# Patient Record
Sex: Female | Born: 1995 | Hispanic: Yes | Marital: Single | Smoking: Never smoker
Health system: Southern US, Community
[De-identification: ages and names within clinical notes are randomized; demographics above are authoritative.]

## PROBLEM LIST (undated history)

## (undated) ENCOUNTER — Inpatient Hospital Stay: Payer: Self-pay

## (undated) DIAGNOSIS — Z789 Other specified health status: Secondary | ICD-10-CM

## (undated) HISTORY — PX: NO PAST SURGERIES: SHX2092

---
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
## 2015-03-16 ENCOUNTER — Ambulatory Visit
Admit: 2015-03-16 | Disposition: A | Discharge status: Home / Self Care | Payer: Self-pay | Attending: Family Medicine | Admitting: Family Medicine

## 2015-08-10 ENCOUNTER — Inpatient Hospital Stay
Admission: EM | Admit: 2015-08-10 | Discharge: 2015-08-12 | DRG: 774 | Disposition: A | Discharge status: Home / Self Care | Payer: Medicaid Other | Attending: Obstetrics and Gynecology | Admitting: Obstetrics and Gynecology

## 2015-08-10 ENCOUNTER — Encounter: Payer: Self-pay | Admitting: *Deleted

## 2015-08-10 DIAGNOSIS — Z3A40 40 weeks gestation of pregnancy: Secondary | ICD-10-CM | POA: Diagnosis present

## 2015-08-10 DIAGNOSIS — A568 Sexually transmitted chlamydial infection of other sites: Secondary | ICD-10-CM | POA: Diagnosis present

## 2015-08-10 DIAGNOSIS — Z79899 Other long term (current) drug therapy: Secondary | ICD-10-CM | POA: Diagnosis present

## 2015-08-10 DIAGNOSIS — O9832 Other infections with a predominantly sexual mode of transmission complicating childbirth: Principal | ICD-10-CM | POA: Diagnosis present

## 2015-08-10 HISTORY — DX: Other specified health status: Z78.9

## 2015-08-10 LAB — CHLAMYDIA/NGC RT PCR (ARMC ONLY)
CHLAMYDIA TR: NOT DETECTED
N gonorrhoeae: NOT DETECTED

## 2015-08-10 LAB — TYPE AND SCREEN
ABO/RH(D): O POS
Antibody Screen: NEGATIVE

## 2015-08-10 LAB — CBC
HCT: 36.7 % (ref 35.0–47.0)
HEMOGLOBIN: 12.1 g/dL (ref 12.0–16.0)
MCH: 29.9 pg (ref 26.0–34.0)
MCHC: 33 g/dL (ref 32.0–36.0)
MCV: 90.6 fL (ref 80.0–100.0)
Platelets: 225 10*3/uL (ref 150–440)
RBC: 4.05 MIL/uL (ref 3.80–5.20)
RDW: 13.7 % (ref 11.5–14.5)
WBC: 9.8 10*3/uL (ref 3.6–11.0)

## 2015-08-10 LAB — ABO/RH: ABO/RH(D): O POS

## 2015-08-10 MED ORDER — BISACODYL 10 MG RE SUPP: 10.0000 mg | Freq: Every day | RECTAL | Status: DC | PRN

## 2015-08-10 MED ORDER — LANOLIN HYDROUS EX OINT: TOPICAL_OINTMENT | CUTANEOUS | Status: DC | PRN

## 2015-08-10 MED ORDER — OXYTOCIN 40 UNITS IN LACTATED RINGERS INFUSION - SIMPLE MED
62.5000 mL/h | INTRAVENOUS | Status: DC
  Filled 2015-08-10: qty 1000

## 2015-08-10 MED ORDER — SODIUM CHLORIDE 0.9 % IJ SOLN: 3.0000 mL | Freq: Two times a day (BID) | INTRAMUSCULAR | Status: DC

## 2015-08-10 MED ORDER — PRENATAL MULTIVITAMIN CH
1.0000 | ORAL_TABLET | Freq: Every day | ORAL | Status: DC
  Administered 2015-08-11 – 2015-08-12 (×2): 1 via ORAL
  Filled 2015-08-10 (×2): qty 1

## 2015-08-10 MED ORDER — SODIUM CHLORIDE 0.9 % IV SOLN: 2.0000 g | Freq: Once | INTRAVENOUS | Status: DC

## 2015-08-10 MED ORDER — MISOPROSTOL 200 MCG PO TABS
800.0000 ug | ORAL_TABLET | Freq: Once | ORAL | Status: AC
  Administered 2015-08-10: 800 ug via RECTAL

## 2015-08-10 MED ORDER — MEASLES, MUMPS & RUBELLA VAC ~~LOC~~ INJ: 0.5000 mL | INJECTION | Freq: Once | SUBCUTANEOUS | Status: DC

## 2015-08-10 MED ORDER — SODIUM CHLORIDE 0.9 % IV SOLN: 250.0000 mL | INTRAVENOUS | Status: DC | PRN

## 2015-08-10 MED ORDER — DIBUCAINE 1 % RE OINT: 1.0000 "application " | TOPICAL_OINTMENT | RECTAL | Status: DC | PRN

## 2015-08-10 MED ORDER — ONDANSETRON HCL 4 MG/2ML IJ SOLN: 4.0000 mg | Freq: Four times a day (QID) | INTRAMUSCULAR | Status: DC | PRN

## 2015-08-10 MED ORDER — ZOLPIDEM TARTRATE 5 MG PO TABS: 5.0000 mg | ORAL_TABLET | Freq: Every evening | ORAL | Status: DC | PRN

## 2015-08-10 MED ORDER — TETANUS-DIPHTH-ACELL PERTUSSIS 5-2.5-18.5 LF-MCG/0.5 IM SUSP: 0.5000 mL | Freq: Once | INTRAMUSCULAR | Status: DC

## 2015-08-10 MED ORDER — WITCH HAZEL-GLYCERIN EX PADS: 1.0000 "application " | MEDICATED_PAD | CUTANEOUS | Status: DC | PRN

## 2015-08-10 MED ORDER — BUTORPHANOL TARTRATE 1 MG/ML IJ SOLN: 1.0000 mg | INTRAMUSCULAR | Status: DC | PRN

## 2015-08-10 MED ORDER — ACETAMINOPHEN 325 MG PO TABS: 650.0000 mg | ORAL_TABLET | ORAL | Status: DC | PRN

## 2015-08-10 MED ORDER — SENNOSIDES-DOCUSATE SODIUM 8.6-50 MG PO TABS
2.0000 | ORAL_TABLET | ORAL | Status: DC
  Administered 2015-08-11: 2 via ORAL
  Filled 2015-08-10: qty 2

## 2015-08-10 MED ORDER — LACTATED RINGERS IV SOLN: 500.0000 mL | INTRAVENOUS | Status: DC | PRN

## 2015-08-10 MED ORDER — OXYCODONE-ACETAMINOPHEN 5-325 MG PO TABS: 1.0000 | ORAL_TABLET | ORAL | Status: DC | PRN

## 2015-08-10 MED ORDER — ONDANSETRON HCL 4 MG/2ML IJ SOLN: 4.0000 mg | INTRAMUSCULAR | Status: DC | PRN

## 2015-08-10 MED ORDER — OXYTOCIN BOLUS FROM INFUSION: 500.0000 mL | INTRAVENOUS | Status: DC

## 2015-08-10 MED ORDER — DIPHENHYDRAMINE HCL 25 MG PO CAPS: 25.0000 mg | ORAL_CAPSULE | Freq: Four times a day (QID) | ORAL | Status: DC | PRN

## 2015-08-10 MED ORDER — OXYTOCIN 10 UNIT/ML IJ SOLN
10.0000 [IU] | Freq: Once | INTRAMUSCULAR | Status: AC | PRN
  Administered 2015-08-10: 10 [IU] via INTRAMUSCULAR

## 2015-08-10 MED ORDER — CITRIC ACID-SODIUM CITRATE 334-500 MG/5ML PO SOLN: 30.0000 mL | ORAL | Status: DC | PRN

## 2015-08-10 MED ORDER — LACTATED RINGERS IV SOLN: INTRAVENOUS | Status: DC

## 2015-08-10 MED ORDER — BENZOCAINE-MENTHOL 20-0.5 % EX AERO
1.0000 "application " | INHALATION_SPRAY | CUTANEOUS | Status: DC | PRN
  Administered 2015-08-10: 1 via TOPICAL
  Filled 2015-08-10: qty 56

## 2015-08-10 MED ORDER — ONDANSETRON HCL 4 MG PO TABS: 4.0000 mg | ORAL_TABLET | ORAL | Status: DC | PRN

## 2015-08-10 MED ORDER — OXYCODONE-ACETAMINOPHEN 5-325 MG PO TABS: 2.0000 | ORAL_TABLET | ORAL | Status: DC | PRN

## 2015-08-10 MED ORDER — SIMETHICONE 80 MG PO CHEW: 80.0000 mg | CHEWABLE_TABLET | ORAL | Status: DC | PRN

## 2015-08-10 MED ORDER — SODIUM CHLORIDE 0.9 % IJ SOLN: 3.0000 mL | INTRAMUSCULAR | Status: DC | PRN

## 2015-08-10 MED ORDER — FLEET ENEMA 7-19 GM/118ML RE ENEM: 1.0000 | ENEMA | Freq: Every day | RECTAL | Status: DC | PRN

## 2015-08-10 MED ORDER — OXYTOCIN 40 UNITS IN LACTATED RINGERS INFUSION - SIMPLE MED: 62.5000 mL/h | INTRAVENOUS | Status: DC | PRN

## 2015-08-10 MED ORDER — LIDOCAINE HCL (PF) 1 % IJ SOLN
30.0000 mL | INTRAMUSCULAR | Status: DC | PRN
  Filled 2015-08-10: qty 30

## 2015-08-10 MED ORDER — IBUPROFEN 600 MG PO TABS
600.0000 mg | ORAL_TABLET | Freq: Four times a day (QID) | ORAL | Status: DC
  Administered 2015-08-10 – 2015-08-12 (×8): 600 mg via ORAL
  Filled 2015-08-10 (×8): qty 1

## 2015-08-10 NOTE — Progress Notes (Signed)
S: Rating UC's an 8 on 1-10 scalel. + CTX, + LOF, no  VB.  O: Filed Vitals:   08/10/15 0406 08/10/15 0415 08/10/15 0816  BP: 123/82 112/79 102/65  Pulse: 70 76 68  Temp: 97.7 F (36.5 C)  98.6 F (37 C)  TempSrc: Oral  Oral  Resp: 18  16  Height: 5' (1.524 m)    Weight: 150 lb (68.04 kg)      Gen: NAD, AAOx3      Abd: Gravid     Ext: Non-tender, Nonedmeatous    FHT: 140 mod var, 1  Deceleration after AROM to 90- with recover TOCO: Q 1  min SVE:5/90/vtx-1   A/P:  19 y.o. yo G1P0 at [redacted]w[redacted]d for labor.  Labor: progressing well, plans natural.   FWB: Reassuring Cat 2 tracing. EFW  6#8oz. 1 decel noted and now Cat 1.  GBS: neg    Sharee Pimple 9:29 AM

## 2015-08-10 NOTE — Progress Notes (Addendum)
VAGINAL DELIVERY NOTE:  Date of Delivery: 08/10/2015 Primary OB: [facility]  Gestational Age/EDD: [redacted]w[redacted]d 08/10/2015, by Other Basis Antepartum complications: none Attending Physician: Hal Neer, CNM/Dr Beasely Attending (not present), Carlean Jews, CNM  Delivery Type: spontaneous vaginal delivery  Anesthesia: local Laceration: 1st degree Lt peri-urethral and 2nd degree perineal Episiotomy: none Placenta: spontaneous Intrapartum complications: None Estimated Blood Loss: 400 ml GBS: Neg Procedure Details:  Assisting Carlean Jews, CNM with the delivery of infant: Vtx, ant and post shoulder and body del at 1205 and to mom's abd, CCx2 and cut. Cord blood collected.  1st degree and 2nd deg perineal lac repaired with 3-0 vicryl Rapide on CT with 1% xylocaine x 10 mls. Bleeding was controlled with Cytotec. FF and lochia mod. VSS. Hemostasis achieved however, pt passed some clots after the repair. Pitocin 10 mg IM given. Needle and sponge ct correct.  I assisted with the repair of the birth.  Baby: Liveborn female, Apgars, weight7#, 5 oz, baby named Carla Brown. Skin to skin couplet care given.

## 2015-08-10 NOTE — H&P (Signed)
Obstetrics Admission History & Physical  Referring Provider: [facility] Primary OBGYN: [facility]   Chief Complaint: labor pains History of Present Illness  19 y.o. G1P0 @ [redacted]w[redacted]d (Dating by LMP of 11/03/14 with EDD of 08/10/15. Pregnancy complicated by: Chlamydia, UTI, Rt knee pain, acne, Rt hip pain, low back pain presents in labor to [facility] with UC's every 2 mins becoming more uncomfortable.  Carla Brown presents for labor S/S.   Review of Systems: Positive for labor contractions,   Otherwise, her 12 point review of systems is negative or as noted in the History of Present Illness.  Patient Active Problem List   Diagnosis Date Noted  . Indication for care in labor or delivery 08/10/2015     PMHx:  Pmh: ACNE, CHLAMYDIA, UTI, RT KNEE PAIN, RT HIP PAIN, LOW BACK PAIN PSHx:  Past Surgical History  Procedure Laterality Date  . No past surgeries     Medications:  Prescriptions prior to admission  Medication Sig Dispense Refill Last Dose  . Prenatal Vit-Fe Fumarate-FA (PRENATAL MULTIVITAMIN) TABS tablet Take 1 tablet by mouth daily at 12 noon.      Allergies: has No Known Allergies. OBHx:  OB History  Gravida Para Term Preterm AB SAB TAB Ectopic Multiple Living  1             # Outcome Date GA Lbr Len/2nd Weight Sex Delivery Anes PTL Lv  1 Current              GYNHx:  History of abnormal pap smears: No. History of STIs: Yes.  Marland Kitchen             FHx: History reviewed. No pertinent family history. Soc Hx:  Social History   Social History  . Marital Status: Single    Spouse Name: N/A  . Number of Children: N/A  . Years of Education: N/A   Occupational History  . Not on file.   Social History Main Topics  . Smoking status: Never Smoker   . Smokeless tobacco: Not on file  . Alcohol Use: No  . Drug Use: No  . Sexual Activity: Yes   Other Topics Concern  . Not on file   Social History Narrative  . No narrative on file   FOB is  involved with the pregnancy   Objective   Filed Vitals:   08/10/15 0415  BP: 112/79  Pulse: 76  Temp:   Resp:    Temp:  [97.7 F (36.5 C)] 97.7 F (36.5 C) (09/16 0406) Pulse Rate:  [70-76] 76 (09/16 0415) Resp:  [18] 18 (09/16 0406) BP: (112-123)/(79-82) 112/79 mmHg (09/16 0415) Weight:  [150 lb (68.04 kg)] 150 lb (68.04 kg) (09/16 0406) Temp (24hrs), Avg:97.7 F (36.5 C), Min:97.7 F (36.5 C), Max:97.7 F (36.5 C)  No intake or output data in the 24 hours ending 08/10/15 0842    Current Vital Signs 24h Vital Sign Ranges  T 97.7 F (36.5 C) Temp  Avg: 97.7 F (36.5 C)  Min: 97.7 F (36.5 C)  Max: 97.7 F (36.5 C)  BP 112/79 mmHg BP  Min: 112/79  Max: 123/82  HR 76 Pulse  Avg: 73  Min: 70  Max: 76  RR 18 Resp  Avg: 18  Min: 18  Max: 18  SaO2     No Data Recorded       24 Hour I/O Current Shift I/O  Time Ins Outs  EFM: 140 Toco: Q 1-2 MINS  General: Well nourished, well developed female in no acute distress.  Skin:  Warm and dry.  Cardiovascular: S1S2, RRR, No M/R/G. Respiratory:  Clear to auscultation bilateral. Normal respiratory effort. No W/R/R. Abdomen:gRAVID.  Neuro/Psych:  Normal mood and affect.  Extrems: 1+/0  SVE: 3/VTX Leopolds/EFW: VTX  Labs  Admit labs being done Ultrasounds: WNL   Perinatal info  O pos, Antibody neg, Rubella immune, Varicella immune, RPR NR, GC/CH neg, HIV NR, HbSag Neg.   Assessment & Plan   19 y.o. G1P0 @ [redacted]w[redacted]d with signs and symptoms, with labor to be admitted.  IUP:  At terem GBS: neg Continue uterine and fetal monitoring. Monitor Vital Signs.  Antic SVD   Sharee Pimple, MSN, CNM, FNP [facility]

## 2015-08-10 NOTE — Progress Notes (Signed)
   08/10/15 1000  Clinical Encounter Type  Visited With Health care provider  Visit Type Initial  Referral From Nurse  Consult/Referral To Chaplain  Received order, pt declined chaplain visit/ Chap. Karl Ito [phone]

## 2015-08-11 LAB — CBC
HEMATOCRIT: 31.8 % — AB (ref 35.0–47.0)
HEMOGLOBIN: 10.5 g/dL — AB (ref 12.0–16.0)
MCH: 29.9 pg (ref 26.0–34.0)
MCHC: 32.9 g/dL (ref 32.0–36.0)
MCV: 90.9 fL (ref 80.0–100.0)
Platelets: 208 10*3/uL (ref 150–440)
RBC: 3.5 MIL/uL — ABNORMAL LOW (ref 3.80–5.20)
RDW: 13.9 % (ref 11.5–14.5)
WBC: 12.9 10*3/uL — ABNORMAL HIGH (ref 3.6–11.0)

## 2015-08-11 LAB — RPR: RPR: NONREACTIVE

## 2015-08-11 NOTE — Progress Notes (Signed)
Post Partum Day 1 Subjective: no complaints  Objective: Blood pressure 92/47, pulse 88, temperature 97.9 F (36.6 C), temperature source Oral, resp. rate 20, height 5' (1.524 m), weight 150 lb (68.04 kg), SpO2 100 %, unknown if currently breastfeeding.  Physical Exam:  General: alert and cooperative  Heart: S1S2, RRR, No M/R/G. Lungs: CTA bilat Lochia: appropriate, no clots Uterine Fundus: firm, U-2 Eating and urinating without diff  DVT Evaluation: No evidence of DVT seen on physical exam.   Recent Labs  08/10/15 0915 08/11/15 0600  HGB 12.1 10.5*  HCT 36.7 31.8*  WBC 9.8 12.9*  PLT 225 208    Assessment/Plan: Plan for discharge tomorrow   LOS: 1 day   Carla Brown 08/11/2015, 11:26 AM

## 2015-08-12 NOTE — Discharge Summary (Signed)
See last progress note.

## 2015-08-12 NOTE — Progress Notes (Addendum)
Post Partum Day  2 Interpreter with CNM  Subjective: no complaints, stating that baby keeps drinking and drinking and may not be getting enough.  Objective: Blood pressure 92/58, pulse 92, temperature 98.2 F (36.8 C), temperature source Oral, resp. rate 18, height 5' (1.524 m), weight 150 lb (68.04 kg), SpO2 99 %, unknown if currently breastfeeding.  Physical Exam:  General: alert, Awake & oriented x 3 Lochia: appropriate Uterine Fundus: firm Perineum: healing well DVT Evaluation: No evidence of DVT seen on physical exam. Neg Homan's sign   Recent Labs  08/10/15 0915 08/11/15 0600  HGB 12.1 10.5*  HCT 36.7 31.8*  WBC 9.8 12.9*  PLT 225 208   Assessment/Plan: A: 1. NSVD PP day 2 2. Breast/Bottle feeding  P: 1. DC home today 2. Plans Depo at [facility]   LOS: 2 days   Sharee Pimple 08/12/2015, 10:05 AM

## 2015-08-12 NOTE — Discharge Instructions (Signed)
OB Discharge Summary  Patient Name: Carla Brown DOB: 1996-05-13 MRN: [id number] None  Date of admission: 08/10/2015  Date of discharge: 08/12/15   Admitting diagnosis: Onset of Labor   Intrauterine pregnancy: [redacted]w[redacted]d  Secondary diagnosis: Anemia in pregnancy     Discharge diagnosis: NSVD of viable female infant Method of delivery: cesarean vaginal   Intrapartum Procedures: Atificial rupture of membranes   Post partum procedures:none  Complications: heavy bleeding with lacs and uterine atony so Cytotec was given pp   Hospital course: Stable  Physical exam:  General: alert and cooperative Lochia: moderate Uterine Fundus: firm, U-2 Perineum: healing, no hematoma DVT Evaluation: No evidence of DVT seen on physical exam.  Labs: Lab Results  Component Value Date   WBC 12.9* 08/11/2015   HGB 10.5* 08/11/2015   HCT 31.8* 08/11/2015   MCV 90.9 08/11/2015   PLT 208 08/11/2015   CMP Latest Ref Rng 12/12/2013  Glucose 65-99 mg/dL 83  BUN 4-09 mg/dL 14  Creatinine 8.11-9.14 mg/dL 7.82  Sodium 956-213 mmol/L 135  Potassium 3.3-4.7 mmol/L 3.6  Chloride 97-107 mmol/L 103  CO2 16-25 mmol/L 27(H)  Calcium 9.0-10.7 mg/dL 9.0  Total Protein 0.8-6.5 g/dL 7.8(I)  Total Bilirubin 0.2-1.0 mg/dL 0.4  Alkaline Phos - 87  AST 0-26 Unit/L 26  ALT 12-78 U/L 21    Discharge instruction: per After Visit Summary.  Medications:   Medication List    TAKE these medications        prenatal multivitamin Tabs tablet  Take 1 tablet by mouth daily at 12 noon.        Diet: routine diet  Activity: Advance as tolerated. Pelvic rest for 6 weeks. Nothing in the vagina for 6 weeks, i.e. no tampons, douching, or sex.   Outpatient follow up:6 weeks  Postpartum contraception:IUD Depo shots  Newborn Data: Disposition:home with mother  Apgar: APGAR (1 MIN): 9   APGAR (5 MINS): 9   APGAR (10 MINS):     Baby Feeding: Breast/Bottle feeding  08/12/2015 Sharee Pimple,  CNM

## 2015-08-12 NOTE — Progress Notes (Signed)
Patient discharged home with infant. Discharge instructions, prescriptions and follow up appointment given to and reviewed with patient. Patient verbalized understanding. Escorted out with infant by auxillary. 

## 2015-08-12 NOTE — Discharge Summary (Signed)
Care After Vaginal Delivery °Congratulations on your new baby!! ° °Refer to this sheet in the next few weeks. These discharge instructions provide you with information on caring for yourself after delivery. Your caregiver may also give you specific instructions. Your treatment has been planned according to the most current medical practices available, but problems sometimes occur. Call your caregiver if you have any problems or questions after you go home. ° °HOME CARE INSTRUCTIONS °· Take over-the-counter or prescription medicines only as directed by your caregiver or pharmacist. °· Do not drink alcohol, especially if you are breastfeeding or taking medicine to relieve pain. °· Do not chew or smoke tobacco. °· Do not use illegal drugs. °· Continue to use good perineal care. Good perineal care includes: °¨ Wiping your perineum from front to back. °¨ Keeping your perineum clean. °· Do not use tampons or douche until your caregiver says it is okay. °· Shower, wash your hair, and take tub baths as directed by your caregiver. °· Wear a well-fitting bra that provides breast support. °· Eat healthy foods. °· Drink enough fluids to keep your urine clear or pale yellow. °· Eat high-fiber foods such as whole grain cereals and breads, brown rice, beans, and fresh fruits and vegetables every day. These foods may help prevent or relieve constipation. °· Follow your caregiver's recommendations regarding resumption of activities such as climbing stairs, driving, lifting, exercising, or traveling. Specifically, no driving for two weeks, so that you are comfortable reacting quickly in an emergency. °· Talk to your caregiver about resuming sexual activities. Resumption of sexual activities is dependent upon your risk of infection, your rate of healing, and your comfort and desire to resume sexual activity. Usually we recommend waiting about six weeks, or until your bleeding stops and you are interested in sex. °· Try to have someone  help you with your household activities and your newborn for at least a few days after you leave the hospital. Even longer is better. °· Rest as much as possible. Try to rest or take a nap when your newborn is sleeping. Sleep deprivation can be very hard after delivery. °· Increase your activities gradually. °· Keep all of your scheduled postpartum appointments. It is very important to keep your scheduled follow-up appointments. At these appointments, your caregiver will be checking to make sure that you are healing physically and emotionally. ° °SEEK MEDICAL CARE IF:  °· You are passing large clots from your vagina.  °· You have a foul smelling discharge from your vagina. °· You have trouble urinating. °· You are urinating frequently. °· You have pain when you urinate. °· You have a change in your bowel movements. °· You have increasing redness, pain, or swelling near your vaginal incision (episiotomy) or vaginal tear. °· You have pus draining from your episiotomy or vaginal tear. °· Your episiotomy or vaginal tear is separating. °· You have painful, hard, or reddened breasts. °· You have a severe headache. °· You have blurred vision or see spots. °· You feel sad or depressed. °· You have thoughts of hurting yourself or your newborn. °· You have questions about your care, the care of your newborn, or medicines. °· You are dizzy or light-headed. °· You have a rash. °· You have nausea or vomiting. °· You were breastfeeding and have not had a menstrual period within 12 weeks after you stopped breastfeeding. °· You are not breastfeeding and have not had a menstrual period by the 12th week after delivery. °· You   have a fever. ° °SEEK IMMEDIATE MEDICAL CARE IF:  °· You have persistent pain. °· You have chest pain. °· You have shortness of breath. °· You faint. °· You have leg pain. °· You have stomach pain. °· Your vaginal bleeding saturates two or more sanitary pads in 1 hour. ° °MAKE SURE YOU:  °· Understand these  instructions. °· Will get help right away if you are not doing well or get worse. °·  °Document Released: 11/07/2000 Document Revised: 03/27/2014 Document Reviewed: 07/07/2012 ° °ExitCare® Patient Information ©2015 [employer]. This information is not intended to replace advice given to you by your health care provider. Make sure you discuss any questions you have with your health care provider. ° °

## 2015-11-25 NOTE — L&D Delivery Note (Signed)
Delivery Note At 6:38 AM a viable female was delivered via Vaginal, Spontaneous Delivery (Presentation: ;  ).  APGAR: 7, 9; weight 6 lb 11.2 oz (3040 g).  + meconium noted and placental membranes stained  Placenta status: ,intact .  Cord:3v  with the following complications: . Inadequate treatment for +GBS , delayed cord clamping  Anesthesia:lidocaine   Episiotomy: None Lacerations: 1st degree;Perineal Suture Repair: 3.0 Est. Blood Loss (mL):  75 cc  Mom to postpartum.  Baby to Couplet care / Skin to Skin.  SCHERMERHORN,THOMAS 08/30/2016, 7:33 AM

## 2016-06-10 ENCOUNTER — Encounter: Payer: Self-pay | Admitting: Emergency Medicine

## 2016-06-10 ENCOUNTER — Emergency Department: Payer: Medicaid Other

## 2016-06-10 ENCOUNTER — Emergency Department
Admission: EM | Admit: 2016-06-10 | Discharge: 2016-06-10 | Disposition: A | Discharge status: Home / Self Care | Payer: Medicaid Other | Attending: Emergency Medicine | Admitting: Emergency Medicine

## 2016-06-10 DIAGNOSIS — R079 Chest pain, unspecified: Secondary | ICD-10-CM

## 2016-06-10 DIAGNOSIS — Z3A28 28 weeks gestation of pregnancy: Secondary | ICD-10-CM | POA: Insufficient documentation

## 2016-06-10 DIAGNOSIS — R0602 Shortness of breath: Secondary | ICD-10-CM

## 2016-06-10 DIAGNOSIS — Z79899 Other long term (current) drug therapy: Secondary | ICD-10-CM | POA: Diagnosis not present

## 2016-06-10 DIAGNOSIS — J942 Hemothorax: Secondary | ICD-10-CM | POA: Diagnosis not present

## 2016-06-10 DIAGNOSIS — O99512 Diseases of the respiratory system complicating pregnancy, second trimester: Secondary | ICD-10-CM | POA: Insufficient documentation

## 2016-06-10 LAB — BASIC METABOLIC PANEL
Anion gap: 7 (ref 5–15)
BUN: 8 mg/dL (ref 6–20)
CALCIUM: 8.2 mg/dL — AB (ref 8.9–10.3)
CHLORIDE: 105 mmol/L (ref 101–111)
CO2: 21 mmol/L — AB (ref 22–32)
Glucose, Bld: 95 mg/dL (ref 65–99)
POTASSIUM: 3.5 mmol/L (ref 3.5–5.1)
SODIUM: 133 mmol/L — AB (ref 135–145)

## 2016-06-10 LAB — CBC
HCT: 33.2 % — ABNORMAL LOW (ref 35.0–47.0)
Hemoglobin: 11.5 g/dL — ABNORMAL LOW (ref 12.0–16.0)
MCH: 30.1 pg (ref 26.0–34.0)
MCHC: 34.8 g/dL (ref 32.0–36.0)
MCV: 86.6 fL (ref 80.0–100.0)
PLATELETS: 250 10*3/uL (ref 150–440)
RBC: 3.83 MIL/uL (ref 3.80–5.20)
RDW: 13.2 % (ref 11.5–14.5)
WBC: 10.8 10*3/uL (ref 3.6–11.0)

## 2016-06-10 LAB — TROPONIN I: Troponin I: <0.03 ng/mL (ref <0.03)

## 2016-06-10 LAB — FIBRIN DERIVATIVES D-DIMER (ARMC ONLY): FIBRIN DERIVATIVES D-DIMER (ARMC): 860 — AB (ref 0–499)

## 2016-06-10 MED ORDER — HYDROCODONE-ACETAMINOPHEN 5-325 MG PO TABS: 1.0000 | ORAL_TABLET | Freq: Four times a day (QID) | ORAL | Status: DC | PRN

## 2016-06-10 MED ORDER — ONDANSETRON 4 MG PO TBDP: 4.0000 mg | ORAL_TABLET | Freq: Three times a day (TID) | ORAL | Status: DC | PRN

## 2016-06-10 MED ORDER — ONDANSETRON 4 MG PO TBDP
4.0000 mg | ORAL_TABLET | Freq: Once | ORAL | Status: AC
  Administered 2016-06-10: 4 mg via ORAL
  Filled 2016-06-10: qty 1

## 2016-06-10 MED ORDER — IOPAMIDOL (ISOVUE-370) INJECTION 76%
100.0000 mL | Freq: Once | INTRAVENOUS | Status: AC | PRN
  Administered 2016-06-10: 100 mL via INTRAVENOUS

## 2016-06-10 MED ORDER — HYDROCODONE-ACETAMINOPHEN 5-325 MG PO TABS
1.0000 | ORAL_TABLET | Freq: Once | ORAL | Status: AC
  Administered 2016-06-10: 1 via ORAL
  Filled 2016-06-10: qty 1

## 2016-06-10 NOTE — ED Provider Notes (Signed)
[facility] Emergency Department Provider Note  ___________________________________________  Time seen: Approximately 3:34 AM  I have reviewed the triage vital signs and the nursing notes.   HISTORY  Chief Complaint Back Pain and Shortness of Breath    HPI Carla Brown is a 20 y.o. female Hispanic who along with interpreter stated that she was having shortness of breath and pain in her right upper chest and right upper back every time she takes a deep breath. Patient states that her symptoms started yesterday morning. She denies any recent cough or cold symptoms. She's had no fever or chills. She's had no nausea vomiting or change in her bowels. She had no significant rash. Patient is approximately 28 weeks OB. Patient is a G2 P1   Past Medical History  Diagnosis Date  . Medical history non-contributory     Patient Active Problem List   Diagnosis Date Noted  . Indication for care in labor or delivery 08/10/2015  . Indication for care in labor and delivery, delivered 08/10/2015    Past Surgical History  Procedure Laterality Date  . No past surgeries      Current Outpatient Rx  Name  Route  Sig  Dispense  Refill  . Prenatal Vit-Fe Fumarate-FA (PRENATAL MULTIVITAMIN) TABS tablet   Oral   Take 1 tablet by mouth daily at 12 noon.         Marland Kitchen. HYDROcodone-acetaminophen (NORCO/VICODIN) 5-325 MG tablet   Oral   Take 1 tablet by mouth every 6 (six) hours as needed for moderate pain.   15 tablet   0   . ondansetron (ZOFRAN ODT) 4 MG disintegrating tablet   Oral   Take 1 tablet (4 mg total) by mouth every 8 (eight) hours as needed for nausea or vomiting.   12 tablet   0     Allergies Review of patient's allergies indicates no known allergies.  No family history on file.  Social History Social History  Substance Use Topics  . Smoking status: Never Smoker   . Smokeless tobacco: None  . Alcohol Use: No    Review of  Systems Constitutional: No fever/chills Eyes: No visual changes. ENT: No sore throat. Cardiovascular: Positive for pain in her right upper chest and right upper back every time she takes a deep breath. Respiratory: Positive for shortness of breath. Gastrointestinal: No abdominal pain.  No nausea, no vomiting.  No diarrhea.  No constipation. Genitourinary: Negative for dysuria. Musculoskeletal: Negative for back pain. Skin: Negative for rash. Neurological: Negative for headaches, focal weakness or numbness.  10-point ROS otherwise negative.  ____________________________________________   PHYSICAL EXAM:  VITAL SIGNS: ED Triage Vitals  Enc Vitals Group     BP 06/10/16 0318 109/69 mmHg     Pulse Rate 06/10/16 0318 99     Resp 06/10/16 0318 18     Temp 06/10/16 0318 97.7 F (36.5 C)     Temp Source 06/10/16 0318 Oral     SpO2 06/10/16 0318 97 %     Weight 06/10/16 0318 154 lb (69.854 kg)     Height 06/10/16 0318 5\' 2"  (1.575 m)     Head Cir --      Peak Flow --      Pain Score 06/10/16 0320 8     Pain Loc --      Pain Edu? --      Excl. in GC? --     Constitutional: Alert and oriented. Well appearing and in  no acute distress. Eyes: Conjunctivae are normal. PERRL. EOMI. Head: Atraumatic. Nose: No congestion/rhinnorhea. Mouth/Throat: Mucous membranes are moist.  Oropharynx non-erythematous. Neck: No stridor.   Cardiovascular: Normal rate, regular rhythm. Grossly normal heart sounds.  Good peripheral circulation. Respiratory: Patient is mildly tachypneic.  No retractions. Lungs CTAB. She has no palpable chest wall tenderness Gastrointestinal: Soft and nontender. No distention. No abdominal bruits. No CVA tenderness. Patient with 28 week gravid uterus Musculoskeletal: No lower extremity tenderness nor edema.  No joint effusions. Neurologic:  Normal speech and language. No gross focal neurologic deficits are appreciated. No gait instability. Skin:  Skin is warm, dry and  intact. No rash noted. Psychiatric: Mood and affect are normal. Speech and behavior are normal.  ____________________________________________   LABS (all labs ordered are listed, but only abnormal results are displayed)  Labs Reviewed  BASIC METABOLIC PANEL - Abnormal; Notable for the following:    Sodium 133 (*)    CO2 21 (*)    Creatinine, Ser <0.30 (*)    Calcium 8.2 (*)    All other components within normal limits  CBC - Abnormal; Notable for the following:    Hemoglobin 11.5 (*)    HCT 33.2 (*)    All other components within normal limits  FIBRIN DERIVATIVES D-DIMER ([facility] ONLY) - Abnormal; Notable for the following:    Fibrin derivatives D-dimer ([facility]) 860 (*)    All other components within normal limits  BLOOD GAS, ARTERIAL - Abnormal; Notable for the following:    pCO2 arterial 30 (*)    Bicarbonate 20.9 (*)    Acid-base deficit 2.3 (*)    Allens test (pass/fail) POSITIVE (*)    All other components within normal limits  TROPONIN I   ____________________________________________  EKG  ED ECG REPORT I, Leona Carry, the attending physician, personally viewed and interpreted this ECG.   Date: 06/10/2016  EKG Time: 0333  Rate: 90  Rhythm: normal EKG, normal sinus rhythm  Axis: normal  Intervals:none  ST&T Change: none  ____________________________________________  RADIOLOGY  Ct Angio Chest Pe W/cm &/or Wo Cm  06/10/2016  CLINICAL DATA:  Shortness of breath and right-sided chest pain. Second trimester pregnancy. EXAM: CT ANGIOGRAPHY CHEST WITH CONTRAST TECHNIQUE: Multidetector CT imaging of the chest was performed using the standard protocol during bolus administration of intravenous contrast. Multiplanar CT image reconstructions and MIPs were obtained to evaluate the vascular anatomy. CONTRAST:  73 cc Isovue 370 intravenous COMPARISON:  None. FINDINGS: Cardiovascular: Normal heart size. No pericardial effusion. Negative pulmonary embolism. Negative thoracic  aorta. Mediastinum:  Negative for adenopathy. Lungs/Pleura: There is a trace right pleural effusion with high-density appearance in the posterior sulcus. The neighboring lung is atelectatic. No visible embolism to suggest infarct. No neighboring rib fracture or trauma history to suggest traumatic hemothorax. Spontaneous hemothorax in pregnancy have been described but rare. No historical or laboratory indication of pneumonia. Two-view follow-up chest x-ray after delivery would be reassuring, assuming imaging is not needed sooner. Upper abdomen: No acute findings. Musculoskeletal: No chest wall mass or suspicious bone lesions identified. Review of the MIP images confirms the above findings. IMPRESSION: 1. Trace right pleural effusion with atelectasis. Pleural fluid is complex with internal high-density as seen with hemothorax; unusual in this setting. No visible osseous trauma. 2. No evidence of pulmonary embolism. Electronically Signed   By: Marnee Spring M.D.   On: 06/10/2016 05:25    ____________________________________________   PROCEDURES  Procedure(s) performed: None  Procedures  Critical Care performed:  No  ____________________________________________   INITIAL IMPRESSION / ASSESSMENT AND PLAN / ED COURSE  Pertinent labs & imaging results that were available during my care of the patient were reviewed by me and considered in my medical decision making (see chart for details). 6:00 AM We'll do routine labs as well as a d-dimer to rule out PE. Will get ABG to evaluate her actual oxygen level. Along with interpreter we were able to discuss with patient the possibility of a CT angiogram to rule out PE if necessary. Patient agreed.  6:00 AM Patient's PO2 was 84 on her ABG but her d-dimer was elevated. Patient was still having some tachypnea and pain with inspiration. We decided to go ahead along with recommendation per Dr. Jean Rosenthal from OB/GYN, to get the CT angiogram chest to rule out  PE.  6:00 AM Patient's CT a of the chest shows a small spontaneous pneumothorax. We did not feel that the hemothorax was large enough to have any intervention. We discussed this with patient along with a Spanish interpreter and she is to follow-up with her OB/GYN doctor. Dr. Jean Rosenthal was notified of arm results. Patient was given some hydrocodone to take for pain and was told to return immediately if condition worsens. ____________________________________________   FINAL CLINICAL IMPRESSION(S) / ED DIAGNOSES  Final diagnoses:  Acute chest pain  Shortness of breath  Hemothorax on right      NEW MEDICATIONS STARTED DURING THIS VISIT:  New Prescriptions   HYDROCODONE-ACETAMINOPHEN (NORCO/VICODIN) 5-325 MG TABLET    Take 1 tablet by mouth every 6 (six) hours as needed for moderate pain.   ONDANSETRON (ZOFRAN ODT) 4 MG DISINTEGRATING TABLET    Take 1 tablet (4 mg total) by mouth every 8 (eight) hours as needed for nausea or vomiting.     Note:  This document was prepared using Dragon voice recognition software and may include unintentional dictation errors.    Leona Carry, MD 06/10/16 0600

## 2016-06-10 NOTE — ED Notes (Signed)
Patient transported to CT 

## 2016-06-10 NOTE — ED Notes (Addendum)
Patient ambulatory to triage with steady gait, pt reports having upper back pain, right sided chest pain while breathing in, and shortness of breath since yesterday. Pt reports increased chest pain with inhaling. Pt denies nausea, vomiting, diarrhea or abdominal pain. Pt is [redacted] weeks pregnant. G2P1.

## 2016-06-16 LAB — BLOOD GAS, ARTERIAL
Acid-base deficit: 2.3 mmol/L — ABNORMAL HIGH (ref 0.0–2.0)
Allens test (pass/fail): POSITIVE — AB
BICARBONATE: 20.9 meq/L — AB (ref 21.0–28.0)
FIO2: 0.21
O2 SAT: 96.8 %
PATIENT TEMPERATURE: 37
PO2 ART: 84 mmHg (ref 83.0–108.0)
pCO2 arterial: 30 mmHg — ABNORMAL LOW (ref 32.0–48.0)
pH, Arterial: 7.45 (ref 7.350–7.450)

## 2016-08-29 ENCOUNTER — Encounter: Payer: Self-pay | Admitting: *Deleted

## 2016-08-29 ENCOUNTER — Observation Stay
Admission: EM | Admit: 2016-08-29 | Discharge: 2016-08-29 | Disposition: A | Discharge status: Home / Self Care | Payer: Medicaid Other | Source: Home / Self Care | Admitting: Obstetrics and Gynecology

## 2016-08-29 DIAGNOSIS — Z3490 Encounter for supervision of normal pregnancy, unspecified, unspecified trimester: Secondary | ICD-10-CM

## 2016-08-29 DIAGNOSIS — Z3A00 Weeks of gestation of pregnancy not specified: Secondary | ICD-10-CM | POA: Insufficient documentation

## 2016-08-29 DIAGNOSIS — O479 False labor, unspecified: Secondary | ICD-10-CM

## 2016-08-29 NOTE — OB Triage Note (Signed)
Contractions started at 0900 this morning, getting stronger.  Positive for fetal movement, a little vaginal bleeding this morning; but not currently bleeding. Denies gush of fluid.

## 2016-08-29 NOTE — Progress Notes (Signed)
Carla Brown, [facility] interpreter at the bedside for spanish translation; per pt. Request.

## 2016-08-29 NOTE — Discharge Instructions (Signed)
Labor precautions reviewed with patient, patient verbalized understanding, copy signed and given to patient.  Pt. [facility]our[facility]

## 2016-08-30 ENCOUNTER — Inpatient Hospital Stay
Admission: EM | Admit: 2016-08-30 | Discharge: 2016-09-01 | DRG: 775 | Disposition: A | Discharge status: Home / Self Care | Payer: Medicaid Other | Attending: Obstetrics and Gynecology | Admitting: Obstetrics and Gynecology

## 2016-08-30 ENCOUNTER — Encounter: Payer: Self-pay | Admitting: *Deleted

## 2016-08-30 DIAGNOSIS — Z3A40 40 weeks gestation of pregnancy: Secondary | ICD-10-CM | POA: Diagnosis not present

## 2016-08-30 DIAGNOSIS — Z3483 Encounter for supervision of other normal pregnancy, third trimester: Secondary | ICD-10-CM | POA: Diagnosis present

## 2016-08-30 DIAGNOSIS — O99824 Streptococcus B carrier state complicating childbirth: Secondary | ICD-10-CM | POA: Diagnosis present

## 2016-08-30 DIAGNOSIS — O479 False labor, unspecified: Secondary | ICD-10-CM | POA: Diagnosis present

## 2016-08-30 LAB — CBC
HCT: 34.9 % — ABNORMAL LOW (ref 35.0–47.0)
Hemoglobin: 11.7 g/dL — ABNORMAL LOW (ref 12.0–16.0)
MCH: 27.3 pg (ref 26.0–34.0)
MCHC: 33.5 g/dL (ref 32.0–36.0)
MCV: 81.4 fL (ref 80.0–100.0)
PLATELETS: 279 10*3/uL (ref 150–440)
RBC: 4.29 MIL/uL (ref 3.80–5.20)
RDW: 16.5 % — ABNORMAL HIGH (ref 11.5–14.5)
WBC: 11.2 10*3/uL — ABNORMAL HIGH (ref 3.6–11.0)

## 2016-08-30 LAB — TYPE AND SCREEN
ABO/RH(D): O POS
ANTIBODY SCREEN: NEGATIVE

## 2016-08-30 MED ORDER — DIPHENHYDRAMINE HCL 25 MG PO CAPS: 25.0000 mg | ORAL_CAPSULE | Freq: Four times a day (QID) | ORAL | Status: DC | PRN

## 2016-08-30 MED ORDER — ACETAMINOPHEN 325 MG PO TABS: 650.0000 mg | ORAL_TABLET | ORAL | Status: DC | PRN

## 2016-08-30 MED ORDER — MAGNESIUM HYDROXIDE 400 MG/5ML PO SUSP: 30.0000 mL | ORAL | Status: DC | PRN

## 2016-08-30 MED ORDER — AMMONIA AROMATIC IN INHA
RESPIRATORY_TRACT | Status: AC
  Filled 2016-08-30: qty 10

## 2016-08-30 MED ORDER — SIMETHICONE 80 MG PO CHEW: 80.0000 mg | CHEWABLE_TABLET | ORAL | Status: DC | PRN

## 2016-08-30 MED ORDER — OXYTOCIN 40 UNITS IN LACTATED RINGERS INFUSION - SIMPLE MED: 2.5000 [IU]/h | INTRAVENOUS | Status: DC

## 2016-08-30 MED ORDER — SENNOSIDES-DOCUSATE SODIUM 8.6-50 MG PO TABS
2.0000 | ORAL_TABLET | ORAL | Status: DC
  Administered 2016-08-31: 2 via ORAL
  Filled 2016-08-30: qty 2

## 2016-08-30 MED ORDER — LIDOCAINE HCL (PF) 1 % IJ SOLN
30.0000 mL | INTRAMUSCULAR | Status: DC | PRN
  Administered 2016-08-30: 30 mL via SUBCUTANEOUS

## 2016-08-30 MED ORDER — SOD CITRATE-CITRIC ACID 500-334 MG/5ML PO SOLN
30.0000 mL | ORAL | Status: DC | PRN
  Filled 2016-08-30: qty 30

## 2016-08-30 MED ORDER — OXYTOCIN BOLUS FROM INFUSION
500.0000 mL | Freq: Once | INTRAVENOUS | Status: AC
  Administered 2016-08-30: 500 mL via INTRAVENOUS

## 2016-08-30 MED ORDER — MISOPROSTOL 200 MCG PO TABS
ORAL_TABLET | ORAL | Status: AC
  Filled 2016-08-30: qty 4

## 2016-08-30 MED ORDER — MEASLES, MUMPS & RUBELLA VAC ~~LOC~~ INJ
0.5000 mL | INJECTION | Freq: Once | SUBCUTANEOUS | Status: DC
  Filled 2016-08-30: qty 0.5

## 2016-08-30 MED ORDER — DIBUCAINE 1 % RE OINT: 1.0000 "application " | TOPICAL_OINTMENT | RECTAL | Status: DC | PRN

## 2016-08-30 MED ORDER — IBUPROFEN 600 MG PO TABS
ORAL_TABLET | ORAL | Status: AC
  Administered 2016-08-30: 600 mg via ORAL
  Filled 2016-08-30: qty 1

## 2016-08-30 MED ORDER — IBUPROFEN 600 MG PO TABS
600.0000 mg | ORAL_TABLET | Freq: Four times a day (QID) | ORAL | Status: DC
  Administered 2016-08-30 – 2016-09-01 (×7): 600 mg via ORAL
  Filled 2016-08-30 (×6): qty 1

## 2016-08-30 MED ORDER — WITCH HAZEL-GLYCERIN EX PADS: 1.0000 "application " | MEDICATED_PAD | CUTANEOUS | Status: DC | PRN

## 2016-08-30 MED ORDER — ONDANSETRON HCL 4 MG/2ML IJ SOLN: 4.0000 mg | INTRAMUSCULAR | Status: DC | PRN

## 2016-08-30 MED ORDER — OXYTOCIN 10 UNIT/ML IJ SOLN
INTRAMUSCULAR | Status: AC
  Filled 2016-08-30: qty 2

## 2016-08-30 MED ORDER — OXYTOCIN 40 UNITS IN LACTATED RINGERS INFUSION - SIMPLE MED
INTRAVENOUS | Status: AC
  Administered 2016-08-30: 500 mL via INTRAVENOUS
  Filled 2016-08-30: qty 1000

## 2016-08-30 MED ORDER — ONDANSETRON HCL 4 MG/2ML IJ SOLN: 4.0000 mg | Freq: Four times a day (QID) | INTRAMUSCULAR | Status: DC | PRN

## 2016-08-30 MED ORDER — LACTATED RINGERS IV SOLN
INTRAVENOUS | Status: DC
  Administered 2016-08-30: 05:00:00 via INTRAVENOUS

## 2016-08-30 MED ORDER — SODIUM CHLORIDE 0.9 % IV SOLN
INTRAVENOUS | Status: AC
  Administered 2016-08-30: 05:00:00
  Filled 2016-08-30: qty 2000

## 2016-08-30 MED ORDER — OXYCODONE-ACETAMINOPHEN 5-325 MG PO TABS: 1.0000 | ORAL_TABLET | ORAL | Status: DC | PRN

## 2016-08-30 MED ORDER — SODIUM CHLORIDE 0.9 % IV SOLN
2.0000 g | Freq: Four times a day (QID) | INTRAVENOUS | Status: DC
  Filled 2016-08-30 (×5): qty 2000

## 2016-08-30 MED ORDER — BENZOCAINE-MENTHOL 20-0.5 % EX AERO
INHALATION_SPRAY | CUTANEOUS | Status: AC
  Filled 2016-08-30: qty 56

## 2016-08-30 MED ORDER — ONDANSETRON HCL 4 MG PO TABS: 4.0000 mg | ORAL_TABLET | ORAL | Status: DC | PRN

## 2016-08-30 MED ORDER — PRENATAL MULTIVITAMIN CH
1.0000 | ORAL_TABLET | Freq: Every day | ORAL | Status: DC
  Administered 2016-08-30 – 2016-08-31 (×2): 1 via ORAL
  Filled 2016-08-30 (×2): qty 1

## 2016-08-30 MED ORDER — LIDOCAINE HCL (PF) 1 % IJ SOLN
INTRAMUSCULAR | Status: AC
  Administered 2016-08-30: 30 mL via SUBCUTANEOUS
  Filled 2016-08-30: qty 30

## 2016-08-30 MED ORDER — BENZOCAINE-MENTHOL 20-0.5 % EX AERO: 1.0000 "application " | INHALATION_SPRAY | CUTANEOUS | Status: DC | PRN

## 2016-08-30 MED ORDER — LACTATED RINGERS IV SOLN: 500.0000 mL | INTRAVENOUS | Status: DC | PRN

## 2016-08-30 MED ORDER — COCONUT OIL OIL
1.0000 "application " | TOPICAL_OIL | Status: DC | PRN
  Filled 2016-08-30: qty 120

## 2016-08-30 MED ORDER — BUTORPHANOL TARTRATE 1 MG/ML IJ SOLN: 1.0000 mg | INTRAMUSCULAR | Status: DC | PRN

## 2016-08-30 MED ORDER — FERROUS SULFATE 325 (65 FE) MG PO TABS
325.0000 mg | ORAL_TABLET | Freq: Two times a day (BID) | ORAL | Status: DC
  Administered 2016-08-30 – 2016-09-01 (×4): 325 mg via ORAL
  Filled 2016-08-30 (×4): qty 1

## 2016-08-30 MED ORDER — OXYCODONE-ACETAMINOPHEN 5-325 MG PO TABS: 2.0000 | ORAL_TABLET | ORAL | Status: DC | PRN

## 2016-08-30 MED ORDER — ZOLPIDEM TARTRATE 5 MG PO TABS: 5.0000 mg | ORAL_TABLET | Freq: Every evening | ORAL | Status: DC | PRN

## 2016-08-30 NOTE — H&P (Signed)
Carla Brown is a 20 y.o. female presenting for labor. OB History    Gravida Para Term Preterm AB Living   2 1 1     1    SAB TAB Ectopic Multiple Live Births         0 1     Past Medical History:  Diagnosis Date  . Medical history non-contributory   h/o spontaneous hemoothorax , [facility] -no specific tx . 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 NO PAST SURGERIES     Family History: family history is not on file. Social History:  reports that she has never smoked. She has never used smokeless tobacco. She reports that she does not drink alcohol or use drugs.     Maternal Diabetes: No, no record  Genetic Screening: Declined Maternal Ultrasounds/Referrals: Normal Fetal Ultrasounds or other Referrals:  None Maternal Substance Abuse:  No Significant Maternal Medications:  None Significant Maternal Lab Results:  None Other Comments:  None  ROS History Dilation: 10 Effacement (%): 90, 100 Station: -1 Exam by:: LSE Blood pressure 120/67, pulse 86, temperature 98.1 F (36.7 C), temperature source Oral, resp. rate 18, height 5' (1.524 m), weight 163 lb (73.9 kg), unknown if currently breastfeeding. Exam  Lungs cta  cv rrr   Physical Exam  Prenatal labs: ABO, Rh:  O+ Antibody:  neg  Rubella:  imm  RPR:   neg HBsAg:   neg HIV:   neg GBS:   +  Assessment/Plan: Active labor  reasurring fetal monitoring    Carla Brown 08/30/2016, 7:22 AM

## 2016-08-30 NOTE — Discharge Summary (Signed)
Labor check   vss cx closed  Reassuring fetal monitoring per nursing D/c home

## 2016-08-30 NOTE — Discharge Summary (Signed)
Obstetric Discharge Summary Reason for Admission: onset of labor Prenatal Procedures: none Intrapartum Procedures: GBS prophylaxis( one dose), + meconium staining  Postpartum Procedures: none Complications-Operative and Postpartum: first degree perineal laceration Hemoglobin  Date Value Ref Range Status  08/31/2016 9.6 (L) 12.0 - 16.0 g/dL Final   HGB  Date Value Ref Range Status  12/12/2013 13.5 12.0 - 16.0 g/dL Final   HCT  Date Value Ref Range Status  08/31/2016 29.5 (L) 35.0 - 47.0 % Final  12/12/2013 41.1 35.0 - 47.0 % Final   POD#1 hct 29.5 % Physical Exam:  General: alert and cooperative Lochia: appropriate Uterine Fundus: firm Incision: n/a DVT Evaluation: No evidence of DVT seen on physical exam.  Discharge Diagnoses: Term Pregnancy-delivered  Discharge Information: Date: 09/01/2016 Activity: unrestricted Diet: routine Medications: Ibuprofen and Iron Condition: stable Discharge to: home   Newborn Data: Live born female on 08/30/16 at 0638 Birth Weight: 6 lb 11.2 oz (3040 g) APGAR: 7, 9  Home with mother.  Carla Brown 09/01/2016, 7:39 AM

## 2016-08-31 LAB — CBC
HCT: 29.5 % — ABNORMAL LOW (ref 35.0–47.0)
Hemoglobin: 9.6 g/dL — ABNORMAL LOW (ref 12.0–16.0)
MCH: 26.3 pg (ref 26.0–34.0)
MCHC: 32.6 g/dL (ref 32.0–36.0)
MCV: 80.7 fL (ref 80.0–100.0)
PLATELETS: 245 10*3/uL (ref 150–440)
RBC: 3.66 MIL/uL — AB (ref 3.80–5.20)
RDW: 16.4 % — ABNORMAL HIGH (ref 11.5–14.5)
WBC: 10.8 10*3/uL (ref 3.6–11.0)

## 2016-08-31 LAB — RPR: RPR: NONREACTIVE

## 2016-08-31 NOTE — Progress Notes (Signed)
Post Partum Day 1 Subjective: no complaints  Objective: Blood pressure (!) 88/49, pulse 79, temperature 97.5 F (36.4 C), temperature source Oral, resp. rate 16, height 5' (1.524 m), weight 163 lb (73.9 kg), SpO2 100 %, unknown if currently breastfeeding.  Physical Exam:  General: alert and cooperative Lochia: appropriate Uterine Fundus: firm Incision: n/a DVT Evaluation: No evidence of DVT seen on physical exam.   Recent Labs  08/30/16 0503 08/31/16 0535  HGB 11.7* 9.6*  HCT 34.9* 29.5*    Assessment/Plan: Plan for discharge tomorrow Baby here for partially treated GBS  LOS: 1 day   Carla Brown 08/31/2016, 11:50 AM

## 2016-09-01 MED ORDER — IBUPROFEN 600 MG PO TABS: 600.0000 mg | ORAL_TABLET | Freq: Four times a day (QID) | ORAL | 0 refills | Status: DC

## 2016-09-01 MED ORDER — FERROUS SULFATE 325 (65 FE) MG PO TABS: 325.0000 mg | ORAL_TABLET | Freq: Two times a day (BID) | ORAL | 3 refills | Status: DC

## 2016-09-01 NOTE — Progress Notes (Signed)
Pt discharged home with infant.  Discharge instructions and follow up appointment given to and reviewed with pt.  Pt verbalized understanding.  Escorted by auxillary. 

## 2016-09-01 NOTE — Discharge Instructions (Signed)

## 2019-06-12 ENCOUNTER — Encounter (HOSPITAL_COMMUNITY): Payer: Self-pay | Admitting: Emergency Medicine

## 2019-06-12 ENCOUNTER — Emergency Department (HOSPITAL_COMMUNITY): Payer: Self-pay

## 2019-06-12 ENCOUNTER — Other Ambulatory Visit: Payer: Self-pay

## 2019-06-12 ENCOUNTER — Emergency Department (HOSPITAL_COMMUNITY)
Admission: EM | Admit: 2019-06-12 | Discharge: 2019-06-12 | Disposition: A | Discharge status: Home / Self Care | Payer: Self-pay | Attending: Emergency Medicine | Admitting: Emergency Medicine

## 2019-06-12 DIAGNOSIS — Z79899 Other long term (current) drug therapy: Secondary | ICD-10-CM | POA: Insufficient documentation

## 2019-06-12 DIAGNOSIS — Y999 Unspecified external cause status: Secondary | ICD-10-CM | POA: Insufficient documentation

## 2019-06-12 DIAGNOSIS — S32010A Wedge compression fracture of first lumbar vertebra, initial encounter for closed fracture: Secondary | ICD-10-CM | POA: Insufficient documentation

## 2019-06-12 DIAGNOSIS — S39012A Strain of muscle, fascia and tendon of lower back, initial encounter: Secondary | ICD-10-CM | POA: Insufficient documentation

## 2019-06-12 DIAGNOSIS — Y929 Unspecified place or not applicable: Secondary | ICD-10-CM | POA: Insufficient documentation

## 2019-06-12 DIAGNOSIS — S29019A Strain of muscle and tendon of unspecified wall of thorax, initial encounter: Secondary | ICD-10-CM

## 2019-06-12 DIAGNOSIS — Y939 Activity, unspecified: Secondary | ICD-10-CM | POA: Insufficient documentation

## 2019-06-12 DIAGNOSIS — S29012A Strain of muscle and tendon of back wall of thorax, initial encounter: Secondary | ICD-10-CM | POA: Insufficient documentation

## 2019-06-12 LAB — CBC
HCT: 37.5 % (ref 36.0–46.0)
Hemoglobin: 12.6 g/dL (ref 12.0–15.0)
MCH: 29.8 pg (ref 26.0–34.0)
MCHC: 33.6 g/dL (ref 30.0–36.0)
MCV: 88.7 fL (ref 80.0–100.0)
Platelets: 253 10*3/uL (ref 150–400)
RBC: 4.23 MIL/uL (ref 3.87–5.11)
RDW: 12.8 % (ref 11.5–15.5)
WBC: 11.9 10*3/uL — ABNORMAL HIGH (ref 4.0–10.5)
nRBC: 0 % (ref 0.0–0.2)

## 2019-06-12 LAB — BASIC METABOLIC PANEL
Anion gap: 9 (ref 5–15)
BUN: 11 mg/dL (ref 6–20)
CO2: 21 mmol/L — ABNORMAL LOW (ref 22–32)
Calcium: 8.8 mg/dL — ABNORMAL LOW (ref 8.9–10.3)
Chloride: 106 mmol/L (ref 98–111)
Creatinine, Ser: 0.49 mg/dL (ref 0.44–1.00)
GFR calc Af Amer: >60 mL/min (ref >60)
GFR calc non Af Amer: >60 mL/min (ref >60)
Glucose, Bld: 119 mg/dL — ABNORMAL HIGH (ref 70–99)
Potassium: 3.6 mmol/L (ref 3.5–5.1)
Sodium: 136 mmol/L (ref 135–145)

## 2019-06-12 LAB — I-STAT BETA HCG BLOOD, ED (MC, WL, AP ONLY): I-stat hCG, quantitative: <5 m[IU]/mL (ref <5)

## 2019-06-12 LAB — CBG MONITORING, ED: Glucose-Capillary: 105 mg/dL — ABNORMAL HIGH (ref 70–99)

## 2019-06-12 MED ORDER — IBUPROFEN 400 MG PO TABS
600.0000 mg | ORAL_TABLET | Freq: Once | ORAL | Status: AC
  Administered 2019-06-12: 600 mg via ORAL
  Filled 2019-06-12: qty 1

## 2019-06-12 NOTE — ED Notes (Signed)
Pt given dc instructions  Pt verbalizes understanding  

## 2019-06-12 NOTE — Discharge Instructions (Addendum)
Use Tylenol and Motrin along with ice as needed for pain. Return for uncontrolled pain, weakness or numbness in lower extremities, difficulty with urination or new concerns.
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
## 2020-08-16 ENCOUNTER — Other Ambulatory Visit: Payer: Self-pay | Admitting: Family Medicine

## 2020-08-16 DIAGNOSIS — Z3481 Encounter for supervision of other normal pregnancy, first trimester: Secondary | ICD-10-CM

## 2020-08-24 ENCOUNTER — Emergency Department
Admission: EM | Admit: 2020-08-24 | Discharge: 2020-08-24 | Disposition: A | Discharge status: Home / Self Care | Payer: BC Managed Care – PPO | Attending: Emergency Medicine | Admitting: Emergency Medicine

## 2020-08-24 ENCOUNTER — Emergency Department: Payer: BC Managed Care – PPO

## 2020-08-24 ENCOUNTER — Other Ambulatory Visit: Payer: Self-pay

## 2020-08-24 ENCOUNTER — Encounter: Payer: Self-pay | Admitting: Intensive Care

## 2020-08-24 DIAGNOSIS — Z3A12 12 weeks gestation of pregnancy: Secondary | ICD-10-CM | POA: Diagnosis not present

## 2020-08-24 DIAGNOSIS — R519 Headache, unspecified: Secondary | ICD-10-CM | POA: Diagnosis not present

## 2020-08-24 DIAGNOSIS — O219 Vomiting of pregnancy, unspecified: Secondary | ICD-10-CM | POA: Diagnosis not present

## 2020-08-24 DIAGNOSIS — R112 Nausea with vomiting, unspecified: Secondary | ICD-10-CM

## 2020-08-24 DIAGNOSIS — Z20822 Contact with and (suspected) exposure to covid-19: Secondary | ICD-10-CM | POA: Diagnosis not present

## 2020-08-24 DIAGNOSIS — R109 Unspecified abdominal pain: Secondary | ICD-10-CM | POA: Insufficient documentation

## 2020-08-24 LAB — COMPREHENSIVE METABOLIC PANEL
ALT: 17 U/L (ref 0–44)
AST: 20 U/L (ref 15–41)
Albumin: 3.8 g/dL (ref 3.5–5.0)
Alkaline Phosphatase: 36 U/L — ABNORMAL LOW (ref 38–126)
Anion gap: 9 (ref 5–15)
BUN: 10 mg/dL (ref 6–20)
CO2: 24 mmol/L (ref 22–32)
Calcium: 8.8 mg/dL — ABNORMAL LOW (ref 8.9–10.3)
Chloride: 103 mmol/L (ref 98–111)
Creatinine, Ser: 0.35 mg/dL — ABNORMAL LOW (ref 0.44–1.00)
GFR calc Af Amer: >60 mL/min (ref >60)
GFR calc non Af Amer: >60 mL/min (ref >60)
Glucose, Bld: 80 mg/dL (ref 70–99)
Potassium: 3.6 mmol/L (ref 3.5–5.1)
Sodium: 136 mmol/L (ref 135–145)
Total Bilirubin: 0.8 mg/dL (ref 0.3–1.2)
Total Protein: 7.4 g/dL (ref 6.5–8.1)

## 2020-08-24 LAB — CBC
HCT: 35.6 % — ABNORMAL LOW (ref 36.0–46.0)
Hemoglobin: 12.4 g/dL (ref 12.0–15.0)
MCH: 30.7 pg (ref 26.0–34.0)
MCHC: 34.8 g/dL (ref 30.0–36.0)
MCV: 88.1 fL (ref 80.0–100.0)
Platelets: 267 10*3/uL (ref 150–400)
RBC: 4.04 MIL/uL (ref 3.87–5.11)
RDW: 12.2 % (ref 11.5–15.5)
WBC: 10.9 10*3/uL — ABNORMAL HIGH (ref 4.0–10.5)
nRBC: 0 % (ref 0.0–0.2)

## 2020-08-24 LAB — RESPIRATORY PANEL BY RT PCR (FLU A&B, COVID)
Influenza A by PCR: NEGATIVE
Influenza B by PCR: NEGATIVE
SARS Coronavirus 2 by RT PCR: NEGATIVE

## 2020-08-24 LAB — HCG, QUANTITATIVE, PREGNANCY: hCG, Beta Chain, Quant, S: 65902 m[IU]/mL — ABNORMAL HIGH (ref <5)

## 2020-08-24 LAB — POCT PREGNANCY, URINE: Preg Test, Ur: POSITIVE — AB

## 2020-08-24 MED ORDER — METOCLOPRAMIDE HCL 10 MG PO TABS: 10.0000 mg | ORAL_TABLET | Freq: Three times a day (TID) | ORAL | 0 refills | Status: DC

## 2020-08-24 MED ORDER — DIPHENHYDRAMINE HCL 25 MG PO CAPS
25.0000 mg | ORAL_CAPSULE | Freq: Once | ORAL | Status: AC
  Administered 2020-08-24: 25 mg via ORAL
  Filled 2020-08-24: qty 1

## 2020-08-24 MED ORDER — ACETAMINOPHEN 325 MG PO TABS
650.0000 mg | ORAL_TABLET | Freq: Once | ORAL | Status: AC
  Administered 2020-08-24: 650 mg via ORAL
  Filled 2020-08-24: qty 2

## 2020-08-24 MED ORDER — METOCLOPRAMIDE HCL 10 MG PO TABS
10.0000 mg | ORAL_TABLET | Freq: Once | ORAL | Status: AC
  Administered 2020-08-24: 10 mg via ORAL
  Filled 2020-08-24: qty 1

## 2020-08-24 NOTE — ED Provider Notes (Signed)
[facility] Emergency Department Provider Note  ____________________________________________  Time seen: Approximately 10:16 AM  I have reviewed the triage vital signs and the nursing notes.   HISTORY  Chief Complaint Emesis During Pregnancy    HPI Carla Brown is a 24 y.o. female that presents to emergency department for evaluation of headache, nausea, vomiting since yesterday.  Patient vomited twice yesterday and once today.  Headache wraps around her head.  Patient is [redacted] weeks pregnant.  This is her third pregnancy.  She has already been evaluated by obstetrics.  No fever, shortness of breath, chest pain, abdominal pain, vaginal bleeding.   Past Medical History:  Diagnosis Date   Medical history non-contributory     Patient Active Problem List   Diagnosis Date Noted   Uterine contractions during pregnancy 08/30/2016   Pregnancy 08/29/2016   Indication for care in labor or delivery 08/10/2015   Indication for care in labor and delivery, delivered 08/10/2015    Past Surgical History:  Procedure Laterality Date   NO PAST SURGERIES      Prior to Admission medications   Medication Sig Start Date End Date Taking? Authorizing Provider  ferrous sulfate 325 (65 FE) MG tablet Take 1 tablet (325 mg total) by mouth 2 (two) times daily with a meal. 09/01/16   Ward, Elenora Fender, MD  metoCLOPramide (REGLAN) 10 MG tablet Take 1 tablet (10 mg total) by mouth 3 (three) times daily with meals. 08/24/20 08/24/21  Enid Derry, PA-C  Prenatal Vit-Fe Fumarate-FA (PRENATAL MULTIVITAMIN) TABS tablet Take 1 tablet by mouth daily at 12 noon.    [provider]    Allergies Patient has no known allergies.  History reviewed. No pertinent family history.  Social History Social History   Tobacco Use   Smoking status: Never Smoker   Smokeless tobacco: Never Used  Substance Use Topics   Alcohol use: No   Drug use: No     Review of Systems   Constitutional: No fever/chills ENT: No upper respiratory complaints. Cardiovascular: No chest pain. Respiratory: No SOB. Gastrointestinal: No abdominal pain.  Positive for nausea and vomiting. Genitourinary: Negative for dysuria. Musculoskeletal: Negative for musculoskeletal pain. Skin: Negative for rash, abrasions, lacerations, ecchymosis. Neurological: Negative for numbness or tingling. Positive for headache.   ____________________________________________   PHYSICAL EXAM:  VITAL SIGNS: ED Triage Vitals  Enc Vitals Group     BP 08/24/20 0943 105/71     Pulse Rate 08/24/20 0943 73     Resp 08/24/20 0943 16     Temp 08/24/20 0943 98 F (36.7 C)     Temp Source 08/24/20 0943 Oral     SpO2 08/24/20 0943 99 %     Weight 08/24/20 0944 151 lb 3.2 oz (68.6 kg)     Height 08/24/20 0947 5\' 2"  (1.575 m)     Head Circumference --      Peak Flow --      Pain Score 08/24/20 0947 9     Pain Loc --      Pain Edu? --      Excl. in GC? --      Constitutional: Alert and oriented. Well appearing and in no acute distress. Eyes: Conjunctivae are normal. PERRL. EOMI. Head: Atraumatic. ENT:      Ears:      Nose: No congestion/rhinnorhea.      Mouth/Throat: Mucous membranes are moist.  Neck: No stridor. Cardiovascular: Normal rate, regular rhythm.  Good peripheral circulation. Respiratory: Normal respiratory  effort without tachypnea or retractions. Lungs CTAB. Good air entry to the bases with no decreased or absent breath sounds. Gastrointestinal: Bowel sounds 4 quadrants. Soft and nontender to palpation. No guarding or rigidity. No palpable masses. No distention.  Musculoskeletal: Full range of motion to all extremities. No gross deformities appreciated. Neurologic:  Normal speech and language. No gross focal neurologic deficits are appreciated.  Skin:  Skin is warm, dry and intact. No rash noted. Psychiatric: Mood and affect are normal. Speech and behavior are normal. Patient  exhibits appropriate insight and judgement.   ____________________________________________   LABS (all labs ordered are listed, but only abnormal results are displayed)  Labs Reviewed  CBC - Abnormal; Notable for the following components:      Result Value   WBC 10.9 (*)    HCT 35.6 (*)    All other components within normal limits  COMPREHENSIVE METABOLIC PANEL - Abnormal; Notable for the following components:   Creatinine, Ser 0.35 (*)    Calcium 8.8 (*)    Alkaline Phosphatase 36 (*)    All other components within normal limits  HCG, QUANTITATIVE, PREGNANCY - Abnormal; Notable for the following components:   hCG, Beta Chain, Quant, S 65,902 (*)    All other components within normal limits  POCT PREGNANCY, URINE - Abnormal; Notable for the following components:   Preg Test, Ur POSITIVE (*)    All other components within normal limits  RESPIRATORY PANEL BY RT PCR (FLU A&B, COVID)   ____________________________________________  EKG   ____________________________________________  RADIOLOGY Lexine Baton, personally viewed and evaluated these images (plain radiographs) as part of my medical decision making, as well as reviewing the written report by the radiologist.  US OB Comp Less 14 Wks  Result Date: 08/24/2020 CLINICAL DATA:  Vomiting and headache for 1 day. Positive urine pregnancy. EXAM: OBSTETRIC <14 WK ULTRASOUND TECHNIQUE: Transvaginal ultrasound was performed for complete evaluation of the gestation as well as the maternal uterus, adnexal regions, and pelvic cul-de-sac. COMPARISON:  Ob ultrasound 03/16/2015 FINDINGS: Intrauterine gestational sac: Single Yolk sac:  Not Visualized. Embryo:  Visualized. Cardiac Activity: Visualized. Heart Rate: 150 bpm CRL:   65.4 mm   12 w 6 d                  [place] EDC: 03/02/2021 Subchorionic hemorrhage:  None visualized. Maternal uterus/adnexae: Normal in appearance. IMPRESSION: Single viable intrauterine pregnancy with estimated  gestational age of [redacted] weeks 6 days. Electronically Signed   By: Emmaline Kluver M.D.   On: 08/24/2020 13:07    ____________________________________________    PROCEDURES  Procedure(s) performed:    Procedures    Medications  metoCLOPramide (REGLAN) tablet 10 mg (10 mg Oral Given 08/24/20 1041)  diphenhydrAMINE (BENADRYL) capsule 25 mg (25 mg Oral Given 08/24/20 1041)  acetaminophen (TYLENOL) tablet 650 mg (650 mg Oral Given 08/24/20 1040)     ____________________________________________   INITIAL IMPRESSION / ASSESSMENT AND PLAN / ED COURSE  Pertinent labs & imaging results that were available during my care of the patient were reviewed by me and considered in my medical decision making (see chart for details).  Review of the [place] CSRS was performed in accordance of the [facility] prior to dispensing any controlled drugs.     Patient presented to the emergency department for evaluation of nausea, vomiting, headache in pregnancy for 2 days..  Vital signs and exam are reassuring.  Ultrasound shows single intrauterine pregnancy at 12 weeks and 6 days.  Lab  work is largely unremarkable.  Patient was given a dose of Tylenol, Reglan, Benadryl in the emergency department for symptoms, which resolved her headache and her nausea.  She has been tolerating fluids and crackers in the emergency department without any further vomiting.  She denies any abdominal pain or vaginal bleeding.  She overall feels well now.  Patient will be discharged home with prescriptions for Reglan. Patient is to follow up with OB as directed. Patient is given ED precautions to return to the ED for any worsening or new symptoms.   Carla Brown was evaluated in Emergency Department on 08/24/2020 for the symptoms described in the history of present illness. She was evaluated in the context of the global COVID-19 pandemic, which necessitated consideration that the patient might be at risk for infection with the  SARS-CoV-2 virus that causes COVID-19. Institutional protocols and algorithms that pertain to the evaluation of patients at risk for COVID-19 are in a state of rapid change based on information released by regulatory bodies including the CDC and federal and state organizations. These policies and algorithms were followed during the patient's care in the ED.  ____________________________________________  FINAL CLINICAL IMPRESSION(S) / ED DIAGNOSES  Final diagnoses:  Non-intractable vomiting with nausea, unspecified vomiting type  [redacted] weeks gestation of pregnancy      NEW MEDICATIONS STARTED DURING THIS VISIT:  ED Discharge Orders         Ordered    metoCLOPramide (REGLAN) 10 MG tablet  3 times daily with meals        08/24/20 1430              This chart was dictated using voice recognition software/Dragon. Despite best efforts to proofread, errors can occur which can change the meaning. Any change was purely unintentional.    Enid Derry, PA-C 08/24/20 1755    Shaune Pollack, MD 08/29/20 1229

## 2020-08-24 NOTE — Discharge Instructions (Signed)
Your lab work is all reassuring.  Your ultrasound shows a viable pregnancy at 12 weeks and 6 days.  You can take Reglan up to 3 times a day as needed for vomiting.  Please call your OB this afternoon for a follow-up appointment early next week.  Return to the emergency department for any worsening or concerning symptoms.

## 2020-08-24 NOTE — ED Triage Notes (Signed)
Patient reports she is [redacted] weeks pregnant. N/V and headache started last night and same this AM.

## 2020-08-24 NOTE — ED Notes (Signed)
See triage note  Presents with n/v which started yesterday  States she is 12 weeks preg. But has not has any vomiting until yesterday  Also has h/a for 3 days   No fever

## 2020-10-10 ENCOUNTER — Ambulatory Visit
Admission: RE | Admit: 2020-10-10 | Discharge: 2020-10-10 | Disposition: A | Discharge status: Home / Self Care | Payer: BC Managed Care – PPO | Source: Ambulatory Visit | Attending: Family Medicine | Admitting: Family Medicine

## 2020-10-10 ENCOUNTER — Other Ambulatory Visit: Payer: Self-pay

## 2020-10-10 DIAGNOSIS — Z3481 Encounter for supervision of other normal pregnancy, first trimester: Secondary | ICD-10-CM | POA: Insufficient documentation

## 2020-10-14 ENCOUNTER — Inpatient Hospital Stay
Admission: EM | Admit: 2020-10-14 | Discharge: 2020-10-14 | Disposition: A | Discharge status: Home / Self Care | Payer: Medicaid Other | Attending: Obstetrics and Gynecology | Admitting: Obstetrics and Gynecology

## 2020-10-14 ENCOUNTER — Encounter: Payer: Self-pay | Admitting: Emergency Medicine

## 2020-10-14 ENCOUNTER — Other Ambulatory Visit: Payer: Self-pay

## 2020-10-14 DIAGNOSIS — O26852 Spotting complicating pregnancy, second trimester: Secondary | ICD-10-CM | POA: Diagnosis present

## 2020-10-14 DIAGNOSIS — Z3A20 20 weeks gestation of pregnancy: Secondary | ICD-10-CM | POA: Insufficient documentation

## 2020-10-14 DIAGNOSIS — O209 Hemorrhage in early pregnancy, unspecified: Secondary | ICD-10-CM | POA: Diagnosis present

## 2020-10-14 DIAGNOSIS — O26892 Other specified pregnancy related conditions, second trimester: Secondary | ICD-10-CM | POA: Diagnosis present

## 2020-10-14 LAB — WET PREP, GENITAL
Clue Cells Wet Prep HPF POC: NONE SEEN
Sperm: NONE SEEN
Trich, Wet Prep: NONE SEEN
Yeast Wet Prep HPF POC: NONE SEEN

## 2020-10-14 LAB — COMPREHENSIVE METABOLIC PANEL
ALT: 17 U/L (ref 0–44)
AST: 19 U/L (ref 15–41)
Albumin: 3.4 g/dL — ABNORMAL LOW (ref 3.5–5.0)
Alkaline Phosphatase: 44 U/L (ref 38–126)
Anion gap: 10 (ref 5–15)
BUN: 10 mg/dL (ref 6–20)
CO2: 21 mmol/L — ABNORMAL LOW (ref 22–32)
Calcium: 8.6 mg/dL — ABNORMAL LOW (ref 8.9–10.3)
Chloride: 104 mmol/L (ref 98–111)
Creatinine, Ser: 0.36 mg/dL — ABNORMAL LOW (ref 0.44–1.00)
GFR, Estimated: >60 mL/min (ref >60)
Glucose, Bld: 129 mg/dL — ABNORMAL HIGH (ref 70–99)
Potassium: 3.7 mmol/L (ref 3.5–5.1)
Sodium: 135 mmol/L (ref 135–145)
Total Bilirubin: 0.4 mg/dL (ref 0.3–1.2)
Total Protein: 7 g/dL (ref 6.5–8.1)

## 2020-10-14 LAB — CBC WITH DIFFERENTIAL/PLATELET
Abs Immature Granulocytes: 0.04 10*3/uL (ref 0.00–0.07)
Basophils Absolute: 0 10*3/uL (ref 0.0–0.1)
Basophils Relative: 0 %
Eosinophils Absolute: 0.1 10*3/uL (ref 0.0–0.5)
Eosinophils Relative: 2 %
HCT: 33.9 % — ABNORMAL LOW (ref 36.0–46.0)
Hemoglobin: 11.6 g/dL — ABNORMAL LOW (ref 12.0–15.0)
Immature Granulocytes: 0 %
Lymphocytes Relative: 24 %
Lymphs Abs: 2.3 10*3/uL (ref 0.7–4.0)
MCH: 30.4 pg (ref 26.0–34.0)
MCHC: 34.2 g/dL (ref 30.0–36.0)
MCV: 89 fL (ref 80.0–100.0)
Monocytes Absolute: 0.6 10*3/uL (ref 0.1–1.0)
Monocytes Relative: 7 %
Neutro Abs: 6.5 10*3/uL (ref 1.7–7.7)
Neutrophils Relative %: 67 %
Platelets: 255 10*3/uL (ref 150–400)
RBC: 3.81 MIL/uL — ABNORMAL LOW (ref 3.87–5.11)
RDW: 12.8 % (ref 11.5–15.5)
WBC: 9.6 10*3/uL (ref 4.0–10.5)
nRBC: 0 % (ref 0.0–0.2)

## 2020-10-14 LAB — URINALYSIS, COMPLETE (UACMP) WITH MICROSCOPIC
Bacteria, UA: NONE SEEN
Bilirubin Urine: NEGATIVE
Glucose, UA: NEGATIVE mg/dL
Hgb urine dipstick: NEGATIVE
Ketones, ur: NEGATIVE mg/dL
Leukocytes,Ua: NEGATIVE
Nitrite: NEGATIVE
Protein, ur: NEGATIVE mg/dL
Specific Gravity, Urine: 1.01 (ref 1.005–1.030)
pH: 7 (ref 5.0–8.0)

## 2020-10-14 LAB — CHLAMYDIA/NGC RT PCR (ARMC ONLY)
Chlamydia Tr: NOT DETECTED
N gonorrhoeae: NOT DETECTED

## 2020-10-14 LAB — HCG, QUANTITATIVE, PREGNANCY: hCG, Beta Chain, Quant, S: 15181 m[IU]/mL — ABNORMAL HIGH (ref <5)

## 2020-10-14 LAB — POC URINE PREG, ED: Preg Test, Ur: POSITIVE — AB

## 2020-10-14 LAB — ABO/RH: ABO/RH(D): O POS

## 2020-10-14 MED ORDER — ACETAMINOPHEN 325 MG PO TABS: 650.0000 mg | ORAL_TABLET | ORAL | Status: DC | PRN

## 2020-10-14 NOTE — OB Triage Note (Signed)
Discharge instructions reviewed and pt verbalized understanding. Pt discharged with spouse and stable at the time of discharge.

## 2020-10-14 NOTE — ED Triage Notes (Signed)
Pt to ED via POV. Pt is [redacted] weeks pregnant and c/o of vaginal bleeding. Pt states that there was a lot of blood. Pt denies pain.

## 2020-10-14 NOTE — Discharge Summary (Signed)
See final progress note. 

## 2020-10-14 NOTE — Final Progress Note (Signed)
Physician Final Progress Note  Patient ID: Carla Brown MRN: [id number] DOB/AGE: 02-08-1996 24 y.o.  Admit date: 10/14/2020 Admitting provider: Conard Novak, MD Discharge date: 10/14/2020   Admission Diagnoses: [redacted] weeks gestation of pregnancy  Discharge Diagnoses:  Principal Problem:   Vaginal bleeding before [redacted] weeks gestation Active Problems:   [redacted] weeks gestation of pregnancy   Pelvic pain affecting pregnancy in second trimester, antepartum    History of Present Illness: The patient is a 24 y.o. female G3P2002 at [redacted]w[redacted]d who presents for vaginal bleeding. Patient report "small gush" of blood noted this evening around 1900 when she was voiding. Patient describes the blood as "a little bit red with some brown" in it. Patient denies further bleeding at this time. Patient reports sexual intercourse last evening. Denies irritation or pain in vagina. Patient denies contractions at this time but reports lower back pain that radiates toward the front and down in the pelvis. 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 Patient reports this pain is worse some days more than others.   Past Medical History:  Diagnosis Date  . Medical history non-contributory     Past Surgical History:  Procedure Laterality Date  . NO PAST SURGERIES      No current facility-administered medications on file prior to encounter.   Current Outpatient Medications on File Prior to Encounter  Medication Sig Dispense Refill  . Prenatal Vit-Fe Fumarate-FA (PRENATAL MULTIVITAMIN) TABS tablet Take 1 tablet by mouth daily at 12 noon.    . ferrous sulfate 325 (65 FE) MG tablet Take 1 tablet (325 mg total) by mouth 2 (two) times daily with a meal. (Patient not taking: Reported on 10/14/2020) 30 tablet 3  . metoCLOPramide (REGLAN) 10 MG tablet Take 1 tablet (10 mg total) by mouth 3 (three) times daily with meals.  (Patient not taking: Reported on 10/14/2020) 10 tablet 0    No Known Allergies  Social History   Socioeconomic History  . Marital status: Single    Spouse name: Not on file  . Number of children: Not on file  . Years of education: Not on file  . Highest education level: Not on file  Occupational History  . Not on file  Tobacco Use  . Smoking status: Never Smoker  . Smokeless tobacco: Never Used  Substance and Sexual Activity  . Alcohol use: No  . Drug use: No  . Sexual activity: Yes    Birth control/protection: Implant  Other Topics Concern  . Not on file  Social History Narrative  . Not on file   Social Determinants of Health   Financial Resource Strain:   . Difficulty of Paying Living Expenses: Not on file  Food Insecurity:   . Worried About Programme researcher, broadcasting/film/video in the Last Year: Not on file  . Ran Out of Food in the Last Year: Not on file  Transportation Needs:   . Lack of Transportation (Medical): Not on file  . Lack of Transportation (Non-Medical): Not on file  Physical Activity:   . Days of Exercise per Week: Not on file  . Minutes of Exercise per Session: Not on file  Stress:   . Feeling of Stress : Not on file  Social Connections:   . Frequency of Communication with Friends and Family: Not on file  . Frequency of Social Gatherings with Friends and Family: Not on file  . Attends  Religious Services: Not on file  . Active Member of Clubs or Organizations: Not on file  . Attends Banker Meetings: Not on file  . Marital Status: Not on file  Intimate Partner Violence:   . Fear of Current or Ex-Partner: Not on file  . Emotionally Abused: Not on file  . Physically Abused: Not on file  . Sexually Abused: Not on file    History reviewed. No pertinent family history.   Review of Systems  Constitutional: Negative for fever and malaise/fatigue.  HENT: Negative.   Eyes: Negative.   Respiratory: Negative.   Cardiovascular: Negative.    Gastrointestinal: Negative for abdominal pain, diarrhea, nausea and vomiting.  Genitourinary: Negative for dysuria, flank pain, frequency, hematuria and urgency.  Musculoskeletal: Negative.  Negative for back pain.  Skin: Negative.   Neurological: Negative.   Endo/Heme/Allergies: Negative.   Psychiatric/Behavioral: Negative.      Physical Exam: BP 104/60 (BP Location: Left Arm)   Pulse 74   Temp 98.1 F (36.7 C) (Oral)   Resp 20   Ht 5\' 1"  (1.549 m)   Wt 69.9 kg   SpO2 97%   BMI 29.10 kg/m    Constitutional: Well nourished, gravid female in no acute distress.  HEENT: normal Skin: Warm and dry.  Cardiovascular: Regular rate and rhythm.   Respiratory: Clear to auscultation bilateral. Normal respiratory effort Abdomen: soft, nontender, nondistended, no abnormal masses, no epigastric pain, fundus soft, nontender 20 weeks size and FHT present - 145 bmp Back: no CVAT Neuro/Psych: Alert and Oriented x3. No memory deficits. Normal mood and affect.  .  Pelvic exam: (female chaperone present) is not limited by body habitus Vagina: within normal limits and with normal mucosa, wet prep done, no blood in the vault, white discharge noted Cervix: No blood noted at cervical os, cervix closed   Significant Findings/ Diagnostic Studies: labs:  -GC/CT: neg, wet prep: neg   Hospital Course: The patient was admitted to Labor and Delivery Triage for observation for vaginal bleeding at [redacted] weeks gestation. No bleeding was noted during admission. Pelvic exam negative for blood in vagina or at cervical os. FHTs present (145 bmp) and patient continues to report +FM. Overall assessment reassuring. Patient recommended to rest. Belly band for pelvic pain discomfort with round ligament pain. Patient encouraged to RTC for any additional episodes of bleeding for further evaluation.  Discharge Condition: good  Disposition: Discharge disposition: 01-Home or Self Care  Diet: Regular diet  Discharge  Activity: Activity as tolerated   Allergies as of 10/14/2020   No Known Allergies     Medication List    STOP taking these medications   ferrous sulfate 325 (65 FE) MG tablet   metoCLOPramide 10 MG tablet Commonly known as: REGLAN     TAKE these medications   prenatal multivitamin Tabs tablet Take 1 tablet by mouth daily at 12 noon.        Signed11/23/2021, CNM 10/14/2020, 11:09 PM

## 2020-11-24 ENCOUNTER — Emergency Department
Admission: EM | Admit: 2020-11-24 | Discharge: 2020-11-25 | Disposition: A | Discharge status: Home / Self Care | Payer: BC Managed Care – PPO | Attending: Emergency Medicine | Admitting: Emergency Medicine

## 2020-11-24 ENCOUNTER — Emergency Department: Payer: BC Managed Care – PPO

## 2020-11-24 ENCOUNTER — Other Ambulatory Visit: Payer: Self-pay

## 2020-11-24 DIAGNOSIS — E860 Dehydration: Secondary | ICD-10-CM | POA: Insufficient documentation

## 2020-11-24 DIAGNOSIS — E876 Hypokalemia: Secondary | ICD-10-CM | POA: Diagnosis not present

## 2020-11-24 DIAGNOSIS — M549 Dorsalgia, unspecified: Secondary | ICD-10-CM | POA: Diagnosis not present

## 2020-11-24 DIAGNOSIS — M25512 Pain in left shoulder: Secondary | ICD-10-CM | POA: Insufficient documentation

## 2020-11-24 DIAGNOSIS — R079 Chest pain, unspecified: Secondary | ICD-10-CM | POA: Diagnosis not present

## 2020-11-24 DIAGNOSIS — R7989 Other specified abnormal findings of blood chemistry: Secondary | ICD-10-CM

## 2020-11-24 LAB — FIBRIN DERIVATIVES D-DIMER (ARMC ONLY): Fibrin derivatives D-dimer (ARMC): 721.09 ng/mL (FEU) — ABNORMAL HIGH (ref 0.00–499.00)

## 2020-11-24 LAB — BASIC METABOLIC PANEL
Anion gap: 9 (ref 5–15)
BUN: 7 mg/dL (ref 6–20)
CO2: 20 mmol/L — ABNORMAL LOW (ref 22–32)
Calcium: 8.9 mg/dL (ref 8.9–10.3)
Chloride: 103 mmol/L (ref 98–111)
Creatinine, Ser: <0.3 mg/dL — ABNORMAL LOW (ref 0.44–1.00)
Glucose, Bld: 86 mg/dL (ref 70–99)
Potassium: 3.4 mmol/L — ABNORMAL LOW (ref 3.5–5.1)
Sodium: 132 mmol/L — ABNORMAL LOW (ref 135–145)

## 2020-11-24 LAB — URINALYSIS, COMPLETE (UACMP) WITH MICROSCOPIC
Bilirubin Urine: NEGATIVE
Glucose, UA: 50 mg/dL — AB
Hgb urine dipstick: NEGATIVE
Ketones, ur: NEGATIVE mg/dL
Nitrite: NEGATIVE
Protein, ur: NEGATIVE mg/dL
Specific Gravity, Urine: 1.026 (ref 1.005–1.030)
pH: 5 (ref 5.0–8.0)

## 2020-11-24 LAB — TROPONIN I (HIGH SENSITIVITY)
Troponin I (High Sensitivity): <2 ng/L (ref <18)
Troponin I (High Sensitivity): <2 ng/L (ref <18)

## 2020-11-24 LAB — CBC
HCT: 32.9 % — ABNORMAL LOW (ref 36.0–46.0)
Hemoglobin: 11.3 g/dL — ABNORMAL LOW (ref 12.0–15.0)
MCH: 30.6 pg (ref 26.0–34.0)
MCHC: 34.3 g/dL (ref 30.0–36.0)
MCV: 89.2 fL (ref 80.0–100.0)
Platelets: 275 10*3/uL (ref 150–400)
RBC: 3.69 MIL/uL — ABNORMAL LOW (ref 3.87–5.11)
RDW: 12.6 % (ref 11.5–15.5)
WBC: 10.5 10*3/uL (ref 4.0–10.5)
nRBC: 0 % (ref 0.0–0.2)

## 2020-11-24 LAB — PROCALCITONIN: Procalcitonin: <0.1 ng/mL

## 2020-11-24 LAB — BRAIN NATRIURETIC PEPTIDE: B Natriuretic Peptide: 20.7 pg/mL (ref 0.0–100.0)

## 2020-11-24 LAB — LIPASE, BLOOD: Lipase: 25 U/L (ref 11–51)

## 2020-11-24 LAB — LACTIC ACID, PLASMA: Lactic Acid, Venous: 1 mmol/L (ref 0.5–1.9)

## 2020-11-24 MED ORDER — LACTATED RINGERS IV BOLUS
1000.0000 mL | Freq: Once | INTRAVENOUS | Status: AC
  Administered 2020-11-24: 1000 mL via INTRAVENOUS

## 2020-11-24 MED ORDER — POTASSIUM CHLORIDE CRYS ER 20 MEQ PO TBCR
40.0000 meq | EXTENDED_RELEASE_TABLET | Freq: Once | ORAL | Status: AC
  Administered 2020-11-25: 40 meq via ORAL
  Filled 2020-11-24: qty 2

## 2020-11-24 MED ORDER — LACTATED RINGERS IV BOLUS
1000.0000 mL | Freq: Once | INTRAVENOUS | Status: AC
  Administered 2020-11-25: 1000 mL via INTRAVENOUS

## 2020-11-24 NOTE — ED Triage Notes (Signed)
Pt comes pov with sob and middle back pain for a few days. Denies that anyone else with her is sick.

## 2020-11-24 NOTE — ED Provider Notes (Signed)
[facility] Emergency Department Provider Note  ____________________________________________   None    (approximate)  I have reviewed the triage vital signs and the nursing notes.   HISTORY  Chief Complaint Shortness of Breath and Back Pain   HPI Carla Brown is a 25 y.o. female without significant past medical history who presents for assessment approximately 4 days of some chest pain rating to her left shoulder.  She denies any fevers, cough, nausea, vomiting, diarrhea, dysuria, sore throat, earache, eye, abdominal pain, rash or extremity pain.  No clear alleviating aggravating factors although she does feel like her pain got worse this evening.  She does not smoke or use illegal drugs or drink alcohol regularly.  No prior similar episodes.  No clear alleviating or aggravating factors.          Past Medical History:  Diagnosis Date  . Medical history non-contributory     Patient Active Problem List   Diagnosis Date Noted  . [redacted] weeks gestation of pregnancy 10/14/2020  . Vaginal bleeding before [redacted] weeks gestation 10/14/2020  . Pelvic pain affecting pregnancy in second trimester, antepartum 10/14/2020    Past Surgical History:  Procedure Laterality Date  . NO PAST SURGERIES      Prior to Admission medications   Medication Sig Start Date End Date Taking? Authorizing Provider  Prenatal Vit-Fe Fumarate-FA (PRENATAL MULTIVITAMIN) TABS tablet Take 1 tablet by mouth daily at 12 noon.    [provider]    Allergies Patient has no known allergies.  History reviewed. No pertinent family history.  Social History Social History   Tobacco Use  . Smoking status: Never Smoker  . Smokeless tobacco: Never Used  Substance Use Topics  . Alcohol use: No  . Drug use: No    Review of Systems  ROS    ____________________________________________   PHYSICAL EXAM:  VITAL SIGNS: ED Triage Vitals  Enc Vitals Group     BP  11/24/20 1520 (!) 94/57     Pulse Rate 11/24/20 1520 79     Resp 11/24/20 1520 18     Temp 11/24/20 1520 98.4 F (36.9 C)     Temp Source 11/24/20 1520 Oral     SpO2 11/24/20 1520 97 %     Weight 11/24/20 1529 154 lb (69.9 kg)     Height 11/24/20 1529 5\' 1"  (1.549 m)     Head Circumference --      Peak Flow --      Pain Score 11/24/20 1529 7     Pain Loc --      Pain Edu? --      Excl. in [place]? --    Vitals:   11/24/20 2200 11/24/20 2230  BP: 104/67 108/65  Pulse: 82 67  Resp:    Temp:    SpO2: 99% 98%   Physical Exam Vitals and nursing note reviewed.  Constitutional:      General: She is not in acute distress.    Appearance: She is well-developed and well-nourished.  HENT:     Head: Normocephalic and atraumatic.     Right Ear: External ear normal.     Left Ear: External ear normal.     Nose: Nose normal.     Mouth/Throat:     Mouth: Mucous membranes are moist.  Eyes:     Conjunctiva/sclera: Conjunctivae normal.  Cardiovascular:     Rate and Rhythm: Normal rate and regular rhythm.     Heart sounds:  No murmur heard.   Pulmonary:     Effort: Pulmonary effort is normal. No respiratory distress.     Breath sounds: Normal breath sounds.  Abdominal:     Palpations: Abdomen is soft.     Tenderness: There is no abdominal tenderness.  Musculoskeletal:        General: No edema.     Cervical back: Neck supple.     Right lower leg: No edema.     Left lower leg: No edema.  Skin:    General: Skin is warm and dry.     Capillary Refill: Capillary refill takes less than 2 seconds.  Neurological:     Mental Status: She is alert and oriented to person, place, and time.  Psychiatric:        Mood and Affect: Mood and affect and mood normal.      ____________________________________________   LABS (all labs ordered are listed, but only abnormal results are displayed)  Labs Reviewed  BASIC METABOLIC PANEL - Abnormal; Notable for the following components:      Result  Value   Sodium 132 (*)    Potassium 3.4 (*)    CO2 20 (*)    Creatinine, Ser <0.30 (*)    All other components within normal limits  CBC - Abnormal; Notable for the following components:   RBC 3.69 (*)    Hemoglobin 11.3 (*)    HCT 32.9 (*)    All other components within normal limits  URINALYSIS, COMPLETE (UACMP) WITH MICROSCOPIC - Abnormal; Notable for the following components:   Color, Urine AMBER (*)    APPearance CLOUDY (*)    Glucose, UA 50 (*)    Leukocytes,Ua TRACE (*)    Bacteria, UA RARE (*)    All other components within normal limits  FIBRIN DERIVATIVES D-DIMER ([facility] ONLY) - Abnormal; Notable for the following components:   Fibrin derivatives D-dimer ([facility]) 721.09 (*)    All other components within normal limits  RESP PANEL BY RT-PCR (FLU A&B, COVID) ARPGX2  LIPASE, BLOOD  BRAIN NATRIURETIC PEPTIDE  PROCALCITONIN  LACTIC ACID, PLASMA  PREGNANCY, URINE  LACTIC ACID, PLASMA  TROPONIN I (HIGH SENSITIVITY)  TROPONIN I (HIGH SENSITIVITY)   ____________________________________________  EKG  Sinus rhythm with ventricular rate of 84, normal axis, unremarkable's, some artifact in lead III without any other clear evidence of acute ischemia or other significant underlying arrhythmia.  ____________________________________________  RADIOLOGY  ED MD interpretation: No focal consolidation, large effusion, pneumothorax, edema, or other acute intrathoracic process.  Official radiology report(s): DG Chest 2 View  Result Date: 11/24/2020 CLINICAL DATA:  Shortness of breath. EXAM: CHEST - 2 VIEW COMPARISON:  None. FINDINGS: The heart size and mediastinal contours are within normal limits. Both lungs are clear. No pneumothorax or pleural effusion is noted. The visualized skeletal structures are unremarkable. IMPRESSION: No active cardiopulmonary disease. Electronically Signed   By: Lupita Raider M.D.   On: 11/24/2020 16:13     ____________________________________________   PROCEDURES  Procedure(s) performed (including Critical Care):  .1-3 Lead EKG Interpretation Performed by: Gilles Chiquito, MD Authorized by: Gilles Chiquito, MD     Interpretation: normal     ECG rate assessment: normal     Rhythm: sinus rhythm     Ectopy: none     Conduction: normal       ____________________________________________   INITIAL IMPRESSION / ASSESSMENT AND PLAN / ED COURSE      Patient presents with above-stated history exam for  assessment of chest pain and mid upper back pain that is pleuritic and has been worsening the last couple days.  On arrival patient is borderline hypotensive with BP of 94/57 otherwise stable vital signs on arrival.  Differential includes pneumothorax, pneumonia, PE, pleurisy, myocarditis, ACS, arrhythmia, symptomatic anemia, dehydration, and GI pathologies.  Chest x-ray has no evidence of focal consolidation suggestive of pneumonia, pneumothorax, rib fracture, effusion or other acute thoracic process.  ECG is not suggestive of acute ischemia and I have low suspicion for ACS or myocarditis given 2 nonelevated troponins obtained over 2 hours.  BMP has mild hypokalemia with a K of 3.4 and some mild acidosis with a bicarb of 20 as well as some mild hyponatremia with an NA of 132 and no other significant derangements. I suspect this is secondary to some mild dehydration.  CBC has no evidence of leukocytosis and hemoglobin is at baseline compared to 1 month ago.  Patient has no urinary symptoms and while UA has trace LE S and 11-20 WBCs she has no lower back pain suprapubic discomfort or burning and of low suspicion for cystitis or pyelonephritis at this time.  Lipase is not consistent with acute pancreatitis.  BNP is not elevated and overall presentation not consistent with volume overload.  Lactic acid is 1 and given absence of a low white blood cell count or fever I have low suspicion for  sepsis or serious invasive bacterial infection at this time.  Procol is undetectable and not consistent with acute bacterial infection.  D-dimer is greater than 500 and given her blood pressure and symptom described above and concern for possible PE.  We will plan to obtain CTA chest to assess for evidence of PE.  If this is unremarkable suspect patient may likely have a viral syndrome with COVID-19 panel differential.  She may also be suffering from some pleurisy although CTA is negative I believe she will be safe for discharge with plan for outpatient follow-up.  Care of patient signed over to oncoming provider at approximately 2200.  Plan is to follow-up CTA and reassess patient.   ____________________________________________   FINAL CLINICAL IMPRESSION(S) / ED DIAGNOSES  Final diagnoses:  Chest pain, unspecified type  Acute back pain, unspecified back location, unspecified back pain laterality  Positive D dimer  Dehydration  Hypokalemia    Medications  lactated ringers bolus 1,000 mL (has no administration in time range)  lactated ringers bolus 1,000 mL (1,000 mLs Intravenous New Bag/Given 11/24/20 2138)     ED Discharge Orders    None       Note:  This document was prepared using Dragon voice recognition software and may include unintentional dictation errors.   Gilles Chiquito, MD 11/24/20 [phone]

## 2020-11-24 NOTE — L&D Delivery Note (Signed)
Delivery Note  Ambri Miltner is a [id number] at [redacted]w[redacted]d with an LMP of 06/02/2020, inconsistent with [place] [redacted]w[redacted]d.   First Stage: Labor onset: 1000 Augmentation: AROM Analgesia /Anesthesia intrapartum: None AROM at 2219  Second Stage: Complete dilation at 2226 Onset of pushing at 2226 FHR second stage 135bpm with moderate variability. 20 min tracing obtained prior to changing to intermittent monitoring.  FHR reassuring throughout.   Gaylyn presented to L&D for contractions that had become progressively more painful and regular since 1000.  She was 9/100/-1 with a bulging bag upon arrival to L&D.  She was expectantly managed until augmented by AROM.  She then had a spontaneous urge to push and was C/C/0.  She pushed quickly over approximately 10 minutes.   Delivery of a viable baby boy on 02/24/2021 at 2237 by CNM Delivery of fetal head in OA position with restitution to ROT. Loose nuchal cord x 1;  Anterior then posterior shoulders delivered easily with gentle downward traction. Delivered through loose nuchal cord. Baby placed on mom's chest, and attended to by baby RN. Cord double clamped after cessation of pulsation, cut by father of baby.  Cord blood sample collected: O pos  Third Stage: Oxytocin bolus started after delivery of infant for hemorrhage prophylaxis  Placenta delivered intact with 3 VC @ 2249 Placenta disposition: discarded  Uterine tone firm / bleeding moderate   Periurethral laceration identified, hemostatic   Anesthesia for repair: N/A Repair: N/A Est. Blood Loss (mL):   Complications: None   Mom to postpartum.  Baby to Couplet care / Skin to Skin.  Newborn: Information for the patient's newborn:  Toshie, Demelo [[id number]]  Live born female  Birth Weight: pending  APGAR: 8, 9  Newborn Delivery   Birth date/time: 02/24/2021 22:37:00 Delivery type: Vaginal, Spontaneous      Feeding planned: breast   ---------- Margaretmary Eddy, CNM Certified Nurse  Midwife [place]  [facility]

## 2020-11-25 ENCOUNTER — Encounter: Payer: Self-pay | Admitting: Radiology

## 2020-11-25 ENCOUNTER — Emergency Department: Payer: BC Managed Care – PPO

## 2020-11-25 DIAGNOSIS — R079 Chest pain, unspecified: Secondary | ICD-10-CM | POA: Diagnosis not present

## 2020-11-25 MED ORDER — IOHEXOL 350 MG/ML SOLN
75.0000 mL | Freq: Once | INTRAVENOUS | Status: AC | PRN
  Administered 2020-11-25: 75 mL via INTRAVENOUS

## 2020-11-25 NOTE — ED Provider Notes (Signed)
  Patient received in signout from Dr. Katrinka Blazing pending CTA chest for evaluation of chest and back pain with a positive D-dimer.  CTA chest reviewed without evidence of acute pathology.  I reassessed the patient and she reports that she is pain-free.  We discussed return precautions for the ED and patient is medically stable for discharge home.   Delton Prairie, MD 11/25/20 [phone]

## 2020-11-25 NOTE — Discharge Instructions (Addendum)
Please take Tylenol and ibuprofen/Advil for your pain.  It is safe to take them together, or to alternate them every few hours.  Take up to 1000mg  of Tylenol at a time, up to 4 times per day.  Do not take more than 4000 mg of Tylenol in 24 hours.  For ibuprofen, take 400-600 mg, 4-5 times per day.  Your testing showed signs of mild dehydration, but no significant strain or damage to your heart or lungs.  Return to the ED with any worsening symptoms, passing out episodes or fevers with your symptoms

## 2021-01-06 ENCOUNTER — Other Ambulatory Visit: Payer: Self-pay

## 2021-01-06 ENCOUNTER — Observation Stay
Admission: EM | Admit: 2021-01-06 | Discharge: 2021-01-06 | Disposition: A | Discharge status: Home / Self Care | Payer: BC Managed Care – PPO | Attending: Obstetrics & Gynecology | Admitting: Obstetrics & Gynecology

## 2021-01-06 DIAGNOSIS — O26892 Other specified pregnancy related conditions, second trimester: Secondary | ICD-10-CM

## 2021-01-06 DIAGNOSIS — K219 Gastro-esophageal reflux disease without esophagitis: Secondary | ICD-10-CM | POA: Insufficient documentation

## 2021-01-06 DIAGNOSIS — R109 Unspecified abdominal pain: Secondary | ICD-10-CM | POA: Diagnosis not present

## 2021-01-06 DIAGNOSIS — O26893 Other specified pregnancy related conditions, third trimester: Secondary | ICD-10-CM | POA: Insufficient documentation

## 2021-01-06 DIAGNOSIS — O368190 Decreased fetal movements, unspecified trimester, not applicable or unspecified: Secondary | ICD-10-CM | POA: Diagnosis present

## 2021-01-06 DIAGNOSIS — O368130 Decreased fetal movements, third trimester, not applicable or unspecified: Principal | ICD-10-CM | POA: Insufficient documentation

## 2021-01-06 DIAGNOSIS — Z3A32 32 weeks gestation of pregnancy: Secondary | ICD-10-CM | POA: Insufficient documentation

## 2021-01-06 DIAGNOSIS — O209 Hemorrhage in early pregnancy, unspecified: Secondary | ICD-10-CM

## 2021-01-06 DIAGNOSIS — O24419 Gestational diabetes mellitus in pregnancy, unspecified control: Secondary | ICD-10-CM | POA: Diagnosis not present

## 2021-01-06 DIAGNOSIS — R102 Pelvic and perineal pain: Secondary | ICD-10-CM

## 2021-01-06 DIAGNOSIS — Z3A20 20 weeks gestation of pregnancy: Secondary | ICD-10-CM

## 2021-01-06 DIAGNOSIS — O99613 Diseases of the digestive system complicating pregnancy, third trimester: Secondary | ICD-10-CM | POA: Insufficient documentation

## 2021-01-06 LAB — URINALYSIS, COMPLETE (UACMP) WITH MICROSCOPIC
Bilirubin Urine: NEGATIVE
Glucose, UA: NEGATIVE mg/dL
Ketones, ur: NEGATIVE mg/dL
Nitrite: NEGATIVE
Protein, ur: NEGATIVE mg/dL
Specific Gravity, Urine: 1.02 (ref 1.005–1.030)
pH: 6 (ref 5.0–8.0)

## 2021-01-06 LAB — WET PREP, GENITAL
Clue Cells Wet Prep HPF POC: NONE SEEN
Sperm: NONE SEEN
Trich, Wet Prep: NONE SEEN
Yeast Wet Prep HPF POC: NONE SEEN

## 2021-01-06 MED ORDER — ACETAMINOPHEN 325 MG PO TABS
ORAL_TABLET | ORAL | Status: AC
  Filled 2021-01-06: qty 2

## 2021-01-06 MED ORDER — FAMOTIDINE 40 MG PO TABS: 40.0000 mg | ORAL_TABLET | Freq: Every day | ORAL | Status: DC

## 2021-01-06 MED ORDER — ACETAMINOPHEN 325 MG PO TABS
650.0000 mg | ORAL_TABLET | Freq: Once | ORAL | Status: AC
  Administered 2021-01-06: 650 mg via ORAL

## 2021-01-06 MED ORDER — CALCIUM CARBONATE ANTACID 500 MG PO CHEW
CHEWABLE_TABLET | ORAL | Status: AC
  Administered 2021-01-06: 400 mg via ORAL
  Filled 2021-01-06: qty 2

## 2021-01-06 MED ORDER — CALCIUM CARBONATE ANTACID 500 MG PO CHEW: 400.0000 mg | CHEWABLE_TABLET | Freq: Once | ORAL | Status: AC

## 2021-01-06 NOTE — Discharge Summary (Signed)
Patient ID: Carla Brown MRN: [id number] DOB/AGE: [birth year] 24 y.o.  Admit date: 01/06/2021 Discharge date: 01/06/2021  Admission Diagnoses: Decreased fetal movement, abdominal pain described pressure on the belly and in the vagina, she also reports severe GERD causing N/V.  Denies cramping, UCs, LOF, or VB.  Pain resolved with Tylenol. Factors complicating pregnancy: (per pt) 1. GDM  Discharge Diagnoses: Resolved pain, RNST  Prenatal Procedures: NST  Consults: None  Significant Diagnostic Studies:  Results for orders placed or performed during the hospital encounter of 01/06/21 (from the past 168 hour(s))  Wet prep, genital   Collection Time: 01/06/21  7:34 PM  Result Value Ref Range   Yeast Wet Prep HPF POC NONE SEEN NONE SEEN   Trich, Wet Prep NONE SEEN NONE SEEN   Clue Cells Wet Prep HPF POC NONE SEEN NONE SEEN   WBC, Wet Prep HPF POC RARE (A) NONE SEEN   Sperm NONE SEEN   Urinalysis, Complete w Microscopic   Collection Time: 01/06/21  7:34 PM  Result Value Ref Range   Color, Urine YELLOW (A) YELLOW   APPearance CLEAR (A) CLEAR   Specific Gravity, Urine 1.020 1.005 - 1.030   pH 6.0 5.0 - 8.0   Glucose, UA NEGATIVE NEGATIVE mg/dL   Hgb urine dipstick SMALL (A) NEGATIVE   Bilirubin Urine NEGATIVE NEGATIVE   Ketones, ur NEGATIVE NEGATIVE mg/dL   Protein, ur NEGATIVE NEGATIVE mg/dL   Nitrite NEGATIVE NEGATIVE   Leukocytes,Ua SMALL (A) NEGATIVE   RBC / HPF 6-10 0 - 5 RBC/hpf   WBC, UA 0-5 0 - 5 WBC/hpf   Bacteria, UA FEW (A) NONE SEEN   Squamous Epithelial / LPF 0-5 0 - 5   Mucus PRESENT     Treatments: n/a  Hospital Course:  This is a 25 y.o. F7P1025 with IUP at [redacted]w[redacted]d admitted for decreased fetal movement and abdominal pain which resolved with Tylenol.  GERD helped with TUMS.  RNST.  No leaking of fluid and no bleeding.  She was observed, fetal heart rate monitoring remained reassuring, and she had no signs/symptoms of preterm labor or other maternal-fetal  concerns.  She was deemed stable for discharge to home with outpatient follow up.  Discharge Physical Exam:  BP 107/67 (BP Location: Left Arm)   Pulse 97   Temp 98.2 F (36.8 C) (Oral)   Resp 16   Ht 5' (1.524 m)   Wt 73.5 kg   BMI 31.64 kg/m   General: NAD CV: RRR Pulm: CTABL, nl effort ABD: s/nd/nt, gravid DVT Evaluation: LE non-ttp, no evidence of DVT on exam.  NST: FHR baseline: 135 bpm Variability: moderate Accelerations: yes Decelerations: none Time: 60+ minutes Category/reactivity: reactive   TOCO: quiet SVE: deferred      Discharge Condition: Stable  Disposition:  Discharge disposition: 01-Home or Self Care        Allergies as of 01/06/2021   No Known Allergies     Medication List    TAKE these medications   famotidine 40 MG tablet Commonly known as: PEPCID Take 1 tablet (40 mg total) by mouth daily.   prenatal multivitamin Tabs tablet Take 1 tablet by mouth daily at 12 noon.       Follow-up Information    Center, [facility] Follow up.   Why: Por favor acuda a la cita para 01/14/21. Contact information: [address] [place] [address] [phone]               Signed:  Quillian Quince 01/06/2021 9:34 PM

## 2021-01-06 NOTE — OB Triage Note (Signed)
Pt arrives G3P2 with c/o decreased fetal movement and abdominal pain x3 days which pt states is 6/10. Pt reports that [facility] is her OBGYN.

## 2021-02-10 ENCOUNTER — Observation Stay
Admission: EM | Admit: 2021-02-10 | Discharge: 2021-02-10 | Disposition: A | Discharge status: Home / Self Care | Payer: BC Managed Care – PPO

## 2021-02-10 ENCOUNTER — Encounter: Payer: Self-pay | Admitting: Obstetrics and Gynecology

## 2021-02-10 DIAGNOSIS — Z3A37 37 weeks gestation of pregnancy: Secondary | ICD-10-CM | POA: Insufficient documentation

## 2021-02-10 DIAGNOSIS — Z3A20 20 weeks gestation of pregnancy: Secondary | ICD-10-CM

## 2021-02-10 DIAGNOSIS — Y92410 Unspecified street and highway as the place of occurrence of the external cause: Secondary | ICD-10-CM | POA: Insufficient documentation

## 2021-02-10 DIAGNOSIS — R1030 Lower abdominal pain, unspecified: Secondary | ICD-10-CM | POA: Insufficient documentation

## 2021-02-10 DIAGNOSIS — O26892 Other specified pregnancy related conditions, second trimester: Secondary | ICD-10-CM

## 2021-02-10 DIAGNOSIS — O26893 Other specified pregnancy related conditions, third trimester: Secondary | ICD-10-CM | POA: Diagnosis not present

## 2021-02-10 DIAGNOSIS — O9A213 Injury, poisoning and certain other consequences of external causes complicating pregnancy, third trimester: Secondary | ICD-10-CM | POA: Diagnosis not present

## 2021-02-10 DIAGNOSIS — O209 Hemorrhage in early pregnancy, unspecified: Secondary | ICD-10-CM

## 2021-02-10 DIAGNOSIS — R102 Pelvic and perineal pain: Secondary | ICD-10-CM

## 2021-02-10 MED ORDER — ACETAMINOPHEN 500 MG PO TABS
1000.0000 mg | ORAL_TABLET | Freq: Four times a day (QID) | ORAL | Status: DC | PRN
  Administered 2021-02-10: 1000 mg via ORAL
  Filled 2021-02-10: qty 2
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
## 2021-02-18 ENCOUNTER — Other Ambulatory Visit (HOSPITAL_COMMUNITY): Payer: Self-pay | Admitting: Family Medicine

## 2021-02-18 ENCOUNTER — Other Ambulatory Visit: Payer: Self-pay | Admitting: Family Medicine

## 2021-02-18 DIAGNOSIS — O24419 Gestational diabetes mellitus in pregnancy, unspecified control: Secondary | ICD-10-CM

## 2021-02-22 ENCOUNTER — Other Ambulatory Visit: Payer: Self-pay

## 2021-02-22 ENCOUNTER — Ambulatory Visit
Admission: RE | Admit: 2021-02-22 | Discharge: 2021-02-22 | Disposition: A | Discharge status: Home / Self Care | Payer: BC Managed Care – PPO | Source: Ambulatory Visit | Attending: Family Medicine | Admitting: Family Medicine

## 2021-02-22 DIAGNOSIS — O24419 Gestational diabetes mellitus in pregnancy, unspecified control: Secondary | ICD-10-CM | POA: Insufficient documentation

## 2021-02-24 ENCOUNTER — Encounter: Payer: Self-pay | Admitting: Obstetrics and Gynecology

## 2021-02-24 ENCOUNTER — Other Ambulatory Visit: Payer: Self-pay

## 2021-02-24 ENCOUNTER — Inpatient Hospital Stay
Admission: EM | Admit: 2021-02-24 | Discharge: 2021-02-26 | DRG: 806 | Disposition: A | Discharge status: Home / Self Care | Payer: BC Managed Care – PPO

## 2021-02-24 DIAGNOSIS — Z20822 Contact with and (suspected) exposure to covid-19: Secondary | ICD-10-CM | POA: Diagnosis present

## 2021-02-24 DIAGNOSIS — O26893 Other specified pregnancy related conditions, third trimester: Secondary | ICD-10-CM | POA: Diagnosis present

## 2021-02-24 DIAGNOSIS — O26892 Other specified pregnancy related conditions, second trimester: Secondary | ICD-10-CM

## 2021-02-24 DIAGNOSIS — D500 Iron deficiency anemia secondary to blood loss (chronic): Secondary | ICD-10-CM | POA: Diagnosis present

## 2021-02-24 DIAGNOSIS — Z3A39 39 weeks gestation of pregnancy: Secondary | ICD-10-CM | POA: Diagnosis not present

## 2021-02-24 DIAGNOSIS — O479 False labor, unspecified: Secondary | ICD-10-CM | POA: Diagnosis present

## 2021-02-24 DIAGNOSIS — O9902 Anemia complicating childbirth: Secondary | ICD-10-CM | POA: Diagnosis present

## 2021-02-24 LAB — CBC
HCT: 34.7 % — ABNORMAL LOW (ref 36.0–46.0)
Hemoglobin: 11.7 g/dL — ABNORMAL LOW (ref 12.0–15.0)
MCH: 29.3 pg (ref 26.0–34.0)
MCHC: 33.7 g/dL (ref 30.0–36.0)
MCV: 87 fL (ref 80.0–100.0)
Platelets: 249 10*3/uL (ref 150–400)
RBC: 3.99 MIL/uL (ref 3.87–5.11)
RDW: 14.6 % (ref 11.5–15.5)
WBC: 8.5 10*3/uL (ref 4.0–10.5)
nRBC: 0 % (ref 0.0–0.2)

## 2021-02-24 MED ORDER — IBUPROFEN 600 MG PO TABS
ORAL_TABLET | ORAL | Status: AC
  Filled 2021-02-24: qty 1

## 2021-02-24 MED ORDER — SOD CITRATE-CITRIC ACID 500-334 MG/5ML PO SOLN: 30.0000 mL | ORAL | Status: DC | PRN

## 2021-02-24 MED ORDER — IBUPROFEN 600 MG PO TABS
600.0000 mg | ORAL_TABLET | Freq: Four times a day (QID) | ORAL | Status: DC
  Administered 2021-02-24 – 2021-02-26 (×7): 600 mg via ORAL
  Filled 2021-02-24 (×6): qty 1

## 2021-02-24 MED ORDER — MISOPROSTOL 200 MCG PO TABS
ORAL_TABLET | ORAL | Status: AC
  Filled 2021-02-24: qty 4

## 2021-02-24 MED ORDER — SODIUM CHLORIDE 0.9 % IV SOLN: 250.0000 mL | INTRAVENOUS | Status: DC | PRN

## 2021-02-24 MED ORDER — AMMONIA AROMATIC IN INHA
RESPIRATORY_TRACT | Status: AC
  Filled 2021-02-24: qty 10

## 2021-02-24 MED ORDER — SODIUM CHLORIDE 0.9% FLUSH: 3.0000 mL | Freq: Two times a day (BID) | INTRAVENOUS | Status: DC

## 2021-02-24 MED ORDER — SODIUM CHLORIDE 0.9% FLUSH: 3.0000 mL | INTRAVENOUS | Status: DC | PRN

## 2021-02-24 MED ORDER — OXYTOCIN 10 UNIT/ML IJ SOLN
INTRAMUSCULAR | Status: AC
  Filled 2021-02-24: qty 2

## 2021-02-24 MED ORDER — LIDOCAINE HCL (PF) 1 % IJ SOLN: 30.0000 mL | INTRAMUSCULAR | Status: DC | PRN

## 2021-02-24 MED ORDER — LACTATED RINGERS IV SOLN: INTRAVENOUS | Status: DC

## 2021-02-24 MED ORDER — CALCIUM CARBONATE ANTACID 500 MG PO CHEW: 2.0000 | CHEWABLE_TABLET | ORAL | Status: DC | PRN

## 2021-02-24 MED ORDER — WITCH HAZEL-GLYCERIN EX PADS
1.0000 "application " | MEDICATED_PAD | CUTANEOUS | Status: DC
  Administered 2021-02-24: 1 via TOPICAL
  Filled 2021-02-24 (×2): qty 100

## 2021-02-24 MED ORDER — ACETAMINOPHEN 500 MG PO TABS
1000.0000 mg | ORAL_TABLET | Freq: Four times a day (QID) | ORAL | Status: DC | PRN
  Filled 2021-02-24: qty 2

## 2021-02-24 MED ORDER — BENZOCAINE-MENTHOL 20-0.5 % EX AERO
1.0000 "application " | INHALATION_SPRAY | CUTANEOUS | Status: DC | PRN
  Administered 2021-02-24: 1 via TOPICAL
  Filled 2021-02-24: qty 56

## 2021-02-24 MED ORDER — LACTATED RINGERS IV SOLN: 500.0000 mL | INTRAVENOUS | Status: DC | PRN

## 2021-02-24 MED ORDER — LIDOCAINE HCL (PF) 1 % IJ SOLN
INTRAMUSCULAR | Status: AC
  Filled 2021-02-24: qty 30

## 2021-02-24 MED ORDER — OXYTOCIN BOLUS FROM INFUSION
333.0000 mL | Freq: Once | INTRAVENOUS | Status: AC
  Administered 2021-02-24: 333 mL via INTRAVENOUS

## 2021-02-24 MED ORDER — ACETAMINOPHEN 500 MG PO TABS: 1000.0000 mg | ORAL_TABLET | Freq: Four times a day (QID) | ORAL | Status: DC | PRN

## 2021-02-24 MED ORDER — OXYTOCIN-SODIUM CHLORIDE 30-0.9 UT/500ML-% IV SOLN
2.5000 [IU]/h | INTRAVENOUS | Status: DC
  Administered 2021-02-25: 36 [IU]/h via INTRAVENOUS
  Filled 2021-02-24 (×2): qty 500

## 2021-02-24 MED ORDER — ACETAMINOPHEN 500 MG PO TABS
1000.0000 mg | ORAL_TABLET | Freq: Four times a day (QID) | ORAL | Status: DC | PRN
  Administered 2021-02-24: 1000 mg via ORAL

## 2021-02-24 MED ORDER — DIBUCAINE (PERIANAL) 1 % EX OINT: 1.0000 "application " | TOPICAL_OINTMENT | CUTANEOUS | Status: DC | PRN

## 2021-02-24 MED ORDER — ONDANSETRON HCL 4 MG/2ML IJ SOLN: 4.0000 mg | Freq: Four times a day (QID) | INTRAMUSCULAR | Status: DC | PRN
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
## 2021-02-25 LAB — COMPREHENSIVE METABOLIC PANEL
ALT: 13 U/L (ref 0–44)
AST: 22 U/L (ref 15–41)
Albumin: 2.5 g/dL — ABNORMAL LOW (ref 3.5–5.0)
Alkaline Phosphatase: 153 U/L — ABNORMAL HIGH (ref 38–126)
Anion gap: 5 (ref 5–15)
BUN: 9 mg/dL (ref 6–20)
CO2: 18 mmol/L — ABNORMAL LOW (ref 22–32)
Calcium: 8.2 mg/dL — ABNORMAL LOW (ref 8.9–10.3)
Chloride: 111 mmol/L (ref 98–111)
Creatinine, Ser: 0.37 mg/dL — ABNORMAL LOW (ref 0.44–1.00)
GFR, Estimated: >60 mL/min (ref >60)
Glucose, Bld: 98 mg/dL (ref 70–99)
Potassium: 4.2 mmol/L (ref 3.5–5.1)
Sodium: 134 mmol/L — ABNORMAL LOW (ref 135–145)
Total Bilirubin: 0.7 mg/dL (ref 0.3–1.2)
Total Protein: 5.4 g/dL — ABNORMAL LOW (ref 6.5–8.1)

## 2021-02-25 LAB — APTT: aPTT: 29 seconds (ref 24–36)

## 2021-02-25 LAB — CBC
HCT: 29.8 % — ABNORMAL LOW (ref 36.0–46.0)
HCT: 24.7 % — ABNORMAL LOW (ref 36.0–46.0)
Hemoglobin: 10 g/dL — ABNORMAL LOW (ref 12.0–15.0)
Hemoglobin: 8.1 g/dL — ABNORMAL LOW (ref 12.0–15.0)
MCH: 29.5 pg (ref 26.0–34.0)
MCH: 28.9 pg (ref 26.0–34.0)
MCHC: 33.6 g/dL (ref 30.0–36.0)
MCHC: 32.8 g/dL (ref 30.0–36.0)
MCV: 87.9 fL (ref 80.0–100.0)
MCV: 88.2 fL (ref 80.0–100.0)
Platelets: 219 10*3/uL (ref 150–400)
Platelets: 172 10*3/uL (ref 150–400)
RBC: 3.39 MIL/uL — ABNORMAL LOW (ref 3.87–5.11)
RBC: 2.8 MIL/uL — ABNORMAL LOW (ref 3.87–5.11)
RDW: 14.6 % (ref 11.5–15.5)
RDW: 14.6 % (ref 11.5–15.5)
WBC: 11.5 10*3/uL — ABNORMAL HIGH (ref 4.0–10.5)
WBC: 11.6 10*3/uL — ABNORMAL HIGH (ref 4.0–10.5)
nRBC: 0 % (ref 0.0–0.2)
nRBC: 0 % (ref 0.0–0.2)

## 2021-02-25 LAB — PROTIME-INR
INR: 1.1 (ref 0.8–1.2)
Prothrombin Time: 13.3 seconds (ref 11.4–15.2)

## 2021-02-25 LAB — TYPE AND SCREEN
ABO/RH(D): O POS
Antibody Screen: NEGATIVE

## 2021-02-25 LAB — RPR: RPR Ser Ql: NONREACTIVE

## 2021-02-25 LAB — FIBRINOGEN: Fibrinogen: 391 mg/dL (ref 210–475)

## 2021-02-25 MED ORDER — FENTANYL CITRATE (PF) 100 MCG/2ML IJ SOLN
INTRAMUSCULAR | Status: AC
  Administered 2021-02-25: 100 ug via INTRAVENOUS
  Filled 2021-02-25: qty 2

## 2021-02-25 MED ORDER — SODIUM CHLORIDE 0.9 % IV SOLN
200.0000 mg | Freq: Once | INTRAVENOUS | Status: AC
  Administered 2021-02-25: 200 mg via INTRAVENOUS
  Filled 2021-02-25: qty 10

## 2021-02-25 MED ORDER — PRENATAL MULTIVITAMIN CH
1.0000 | ORAL_TABLET | Freq: Every day | ORAL | Status: DC
  Administered 2021-02-25 – 2021-02-26 (×2): 1 via ORAL
  Filled 2021-02-25 (×2): qty 1

## 2021-02-25 MED ORDER — TRANEXAMIC ACID 1000 MG/10ML IV SOLN
INTRAVENOUS | Status: AC
  Administered 2021-02-25: 1000 mg
  Filled 2021-02-25: qty 10

## 2021-02-25 MED ORDER — METHYLERGONOVINE MALEATE 0.2 MG/ML IJ SOLN
0.2000 mg | Freq: Once | INTRAMUSCULAR | Status: AC
  Administered 2021-02-25: 0.2 mg via INTRAMUSCULAR

## 2021-02-25 MED ORDER — SIMETHICONE 80 MG PO CHEW: 80.0000 mg | CHEWABLE_TABLET | ORAL | Status: DC | PRN

## 2021-02-25 MED ORDER — FENTANYL CITRATE (PF) 100 MCG/2ML IJ SOLN
100.0000 ug | Freq: Once | INTRAMUSCULAR | Status: AC
Start: 2021-02-25 — End: 2021-02-25

## 2021-02-25 MED ORDER — SODIUM CHLORIDE 0.9 % IV BOLUS
500.0000 mL | Freq: Once | INTRAVENOUS | Status: AC
  Administered 2021-02-25: 500 mL via INTRAVENOUS

## 2021-02-25 MED ORDER — METHYLERGONOVINE MALEATE 0.2 MG PO TABS
0.2000 mg | ORAL_TABLET | Freq: Four times a day (QID) | ORAL | Status: AC
  Administered 2021-02-25 – 2021-02-26 (×4): 0.2 mg via ORAL
  Filled 2021-02-25 (×4): qty 1

## 2021-02-25 MED ORDER — LACTATED RINGERS IV SOLN: INTRAVENOUS | Status: AC

## 2021-02-25 MED ORDER — DOCUSATE SODIUM 100 MG PO CAPS
100.0000 mg | ORAL_CAPSULE | Freq: Two times a day (BID) | ORAL | Status: DC
  Administered 2021-02-25 – 2021-02-26 (×4): 100 mg via ORAL
  Filled 2021-02-25 (×4): qty 1

## 2021-02-25 MED ORDER — ACETAMINOPHEN 325 MG PO TABS
650.0000 mg | ORAL_TABLET | Freq: Four times a day (QID) | ORAL | Status: DC | PRN
  Administered 2021-02-25: 650 mg via ORAL
  Filled 2021-02-25: qty 2

## 2021-02-25 MED ORDER — COCONUT OIL OIL
1.0000 "application " | TOPICAL_OIL | Status: DC | PRN
  Administered 2021-02-25: 1 via TOPICAL
  Filled 2021-02-25: qty 120

## 2021-02-25 MED ORDER — ONDANSETRON HCL 4 MG PO TABS: 4.0000 mg | ORAL_TABLET | ORAL | Status: DC | PRN

## 2021-02-25 MED ORDER — DIPHENHYDRAMINE HCL 25 MG PO CAPS: 25.0000 mg | ORAL_CAPSULE | Freq: Four times a day (QID) | ORAL | Status: DC | PRN

## 2021-02-25 MED ORDER — OXYCODONE HCL 5 MG PO TABS
5.0000 mg | ORAL_TABLET | Freq: Four times a day (QID) | ORAL | Status: DC | PRN
Start: 2021-02-25 — End: 2021-02-26
  Administered 2021-02-25: 5 mg via ORAL
  Filled 2021-02-25: qty 1

## 2021-02-25 MED ORDER — TRANEXAMIC ACID-NACL 1000-0.7 MG/100ML-% IV SOLN
1000.0000 mg | Freq: Once | INTRAVENOUS | Status: DC
  Filled 2021-02-25: qty 100

## 2021-02-25 MED ORDER — ONDANSETRON HCL 4 MG/2ML IJ SOLN
4.0000 mg | INTRAMUSCULAR | Status: DC | PRN
Start: 2021-02-24 — End: 2021-02-26

## 2021-02-25 NOTE — Progress Notes (Signed)
Foley removed around 1600. Ambulating well. Pain under control.   Saline lock x2.   Bleeding small. U/E to U/1. Firm. (at all checks).

## 2021-02-25 NOTE — Lactation Note (Signed)
This note was copied from a baby's chart. Lactation Consultation Note  Patient Name: Carla Brown UMPNT'I Date: 02/25/2021 Reason for consult: Initial assessment;Term Age:25 hours    LC Student arrived in the room where mother, father, and infant are present. Mom stated that she attempted to feed the infant at around 7:30am but he really did not eat much because he is so sleepy. LC Student educated the parents on infant feeding patterns and behaviors. Mom consented to trying to stimulate baby and get him to latch. LC Student unswaddled infant and handed him to mom. [facility] Student assisted mom with proper positioning by adding support pillows and adjusting infant in the right position. [facility] Student attempted multiple times to stimulate infant but infant was not interested. LC Student demonstrated hand expression to the mother. A small amount of colostrum was expressed from the breast. Infant was still not interested. Parents were worried about infant spiting up fluid. LC Student provided tips on what to do when baby is spitting up and assured them that this is normal for the early stages of life. LC Student instructed parents to call when baby begins to cue or when they are ready to try to latch him again. Parents had no further questions at this time.   Maternal Data Has patient been taught Hand Expression?: Yes Does the patient have breastfeeding experience prior to this delivery?: Yes  Feeding Mother's Current Feeding Choice: Breast Milk and Formula Nipple Type: Slow - flow  LATCH Score Latch: Too sleepy or reluctant, no latch achieved, no sucking elicited.  Audible Swallowing: None  Type of Nipple: Everted at rest and after stimulation  Comfort (Breast/Nipple): Soft / non-tender  Hold (Positioning): Assistance needed to correctly position infant at breast and maintain latch.  LATCH Score: 5   Lactation Tools Discussed/Used    Interventions Interventions: Breast feeding  basics reviewed;Assisted with latch;Hand express;Breast massage;Support pillows;Adjust position;Education   Consult Status Consult Status: Follow-up Date: 02/25/21 Follow-up type: In-patient    Marysa Wessner Katrinka Blazing 02/25/2021, 12:09 PM

## 2021-02-25 NOTE — Progress Notes (Signed)
Called [facility] for prenatal records. RN stated records will be faxed over ASAP.

## 2021-02-25 NOTE — Significant Event (Signed)
Called to postpartum room d/t vaginal bleeding and significant pain. Percilla was able to void and had 2 large clots come out in the toilet. Fundus at 2/U, deviated to right.  IOW was at bedside and used for communication.  IV fentanyl was given for pain management.  Foley cath placed.  Fundal massage performed and large amount blood with multiple large clots were expressed for qbl.  Methergine 0.2mg  IM given.  LR bolus and pitocin bolus given.  2nd IV started and TXA 1 gram given.  Bleeding significantly improved after clots expressed and foley placed.    QBL for PPH - 1110 ml QBL for delivery -  QBL for recovery -  Total QBL - 1450 ml  Plan:  Updated Dr. Feliberto Gottron on [facility] and interventions  Will give PO methergine every 6 hours for 24 hours   Leave foley cath in place for at least 12 hours   Repeat CBC in 4 hours (ordered for 10 am)  Margaretmary Eddy, CNM Certified Nurse Midwife [place]  [facility]

## 2021-02-25 NOTE — Discharge Instructions (Signed)

## 2021-02-25 NOTE — Progress Notes (Signed)
Post Partum Day 1  Subjective: Doing well, no concerns. Ambulating without difficulty, pain managed with PO meds, tolerating regular diet, and voiding without difficulty.   No fever/chills, chest pain, shortness of breath, nausea/vomiting, or leg pain. No nipple or breast pain.   Objective: BP 105/65 (BP Location: Left Arm)   Pulse 70   Temp 98.2 F (36.8 C) (Oral)   Resp 18   Ht 5' (1.524 m)   Wt 74.8 kg   LMP 06/02/2020   SpO2 99%   Breastfeeding Unknown   BMI 32.22 kg/m    Physical Exam:  General: alert and cooperative Breasts: soft/nontender CV: RRR Pulm: nl effort Abdomen: soft, non-tender Uterine Fundus: firm Incision: n/a Perineum: minimal edema,  Lochia: appropriate DVT Evaluation: No evidence of DVT seen on physical exam. Edinburgh:  Edinburgh Postnatal Depression Scale Screening Tool 02/25/2021  I have been able to laugh and see the funny side of things. (No Data)     Recent Labs    02/24/21 2147 02/25/21 0552  HGB 11.7* 10.0*  HCT 34.7* 29.8*  WBC 8.5 11.5*  PLT 249 219    Assessment/Plan: 25 y.o. G3P3003 postpartum day # 1  -Continue routine postpartum care -Lactation consult PRN for breastfeeding   -Late PPH - will continue Methergine q6hr x 24 hours -RN to call [facility] for PN Records  Disposition: Continue inpatient postpartum care    LOS: 1 day   Yanelly Cantrelle, CNM 02/25/2021, 8:38 AM

## 2021-02-25 NOTE — OB Triage Note (Signed)
Pt is a G3P2 and [redacted]w[redacted]d presenting to L&D with c/o ctx that started around 10:00am 02/24/21. Pt states ctx started inconsistent and have become 3-4 minutes apart for the last hour. Pt reports pain 10/10 on a 0-10 pain scale. Pt denies LOF and VB and confirms positive fetal movement. Monitors applied and assessing. VSS. Provider notified of patient arrival. No further needs at this time.

## 2021-02-25 NOTE — Plan of Care (Signed)
Transferred to Room 337. Alert and oriented with pleasant affect. Assessment and VS WNL. Interpreter Teo [phone] Interpreted. Education Initiated. Oriented to Safety and Security and POC. V/O.

## 2021-02-25 NOTE — Progress Notes (Addendum)
Called to Room by Patient. Went to Pt.'s room and Pt. Was up in the Bathroom with c/o abdominal pain. Blood noted to be dripping down legs. Some drops of blood on floor. Pt. assisted to Toilet and Pt. Voided. As she voided, it sounded as if two large clots were passed. Peri Care completed and there was no visual of clots in toilet water. Pt. Assisted to bed. Fundus massaged and is firm at3/U and is deviated to the right of the abdomen. Bladder scanned and <150cc of Urine in bladder. VS assessed and wnl. Lucile Shutters RN stayed with Pt. As I paged A. Mackie CNM/ All of the above was reported to A. Mackie NNP. I expressed my concern that Pt. had clots in her Uterus since her Bladder was not that full as per Scan. ATami Lin stated she would come and assess Pt. K. Cevallos RN,[facility] asked to come assist  With PP Hemorrhage. Methergine given IM at 0517 as per order.  Hemorrhage cart brought to room. Uterus massaged and remained at 3/U and Pt. With bright red vaginal Bleeding. NS Bolus begun. ATami Lin arrived at [phone] and is assessing Pt. Fentanyl given IV as per order at 0529. Foley placed by A. Mackie CNM and 50cc of clear urine noted in Foley bag. After Foley placed, Mackie evacuated large clots from Uterus. QBL of this was 1,110cc. Uterus is now, U/E and midline. Pt. Denies pain. Labs drawn as per order by C. Cevallos RN.

## 2021-02-26 MED ORDER — IBUPROFEN 600 MG PO TABS: 600.0000 mg | ORAL_TABLET | Freq: Four times a day (QID) | ORAL | 0 refills | Status: DC

## 2021-02-26 MED ORDER — DIPHENHYDRAMINE HCL 25 MG PO CAPS: 25.0000 mg | ORAL_CAPSULE | Freq: Four times a day (QID) | ORAL | 0 refills | Status: DC | PRN

## 2021-02-26 MED ORDER — WITCH HAZEL-GLYCERIN EX PADS: 1.0000 "application " | MEDICATED_PAD | CUTANEOUS | 12 refills | Status: DC

## 2021-02-26 MED ORDER — ACETAMINOPHEN 325 MG PO TABS: 650.0000 mg | ORAL_TABLET | Freq: Four times a day (QID) | ORAL | 0 refills | Status: DC | PRN

## 2021-02-26 MED ORDER — COCONUT OIL OIL: 1.0000 "application " | TOPICAL_OIL | 0 refills | Status: DC | PRN

## 2021-02-26 MED ORDER — SIMETHICONE 80 MG PO CHEW: 80.0000 mg | CHEWABLE_TABLET | ORAL | 0 refills | Status: DC | PRN

## 2021-02-26 MED ORDER — DOCUSATE SODIUM 100 MG PO CAPS: 100.0000 mg | ORAL_CAPSULE | Freq: Two times a day (BID) | ORAL | 0 refills | Status: DC

## 2021-02-26 MED ORDER — BENZOCAINE-MENTHOL 20-0.5 % EX AERO: 1.0000 "application " | INHALATION_SPRAY | CUTANEOUS | Status: DC | PRN

## 2021-02-26 NOTE — Lactation Note (Signed)
This note was copied from a baby's chart. Lactation Consultation Note  Patient Name: Carla Brown HOOIL'N Date: 02/26/2021 Reason for consult: Follow-up assessment Age:25 hours    LC student completed follow up today prior to discharge. LC discussed feeding plan with mother, she reports wanting to continue with breast and formula. Mom reports BF for 10-17mins and then offering formula, infant last fed at 1130am and took 47ml of formula. [facility] student provided education on infant stomach size, encouraged breastfeeding first, and to watch for feeding cues.  LC provided contact information for lactation services as needed.    Feeding Mother's Current Feeding Choice: Breast Milk and Formula     Discharge WIC Program: Yes  Consult Status Consult Status: PRN    Audrea Muscat -Lactation Student 02/26/2021, 12:25 PM

## 2021-02-26 NOTE — Progress Notes (Signed)
Pt discharged with infant.  Discharge instructions, prescriptions and follow up appointment given to and reviewed with pt. Pt verbalized understanding. Escorted out by auxillary. 

## 2021-02-27 LAB — RESP PANEL BY RT-PCR (FLU A&B, COVID) ARPGX2
Influenza A by PCR: NEGATIVE
Influenza B by PCR: NEGATIVE
SARS Coronavirus 2 by RT PCR: NEGATIVE

## 2021-02-28 ENCOUNTER — Emergency Department: Payer: BC Managed Care – PPO

## 2021-02-28 ENCOUNTER — Other Ambulatory Visit: Payer: Self-pay

## 2021-02-28 ENCOUNTER — Emergency Department
Admission: EM | Admit: 2021-02-28 | Discharge: 2021-02-28 | Disposition: A | Discharge status: Home / Self Care | Payer: BC Managed Care – PPO | Attending: Emergency Medicine | Admitting: Emergency Medicine

## 2021-02-28 DIAGNOSIS — M79606 Pain in leg, unspecified: Secondary | ICD-10-CM | POA: Diagnosis not present

## 2021-02-28 DIAGNOSIS — D500 Iron deficiency anemia secondary to blood loss (chronic): Secondary | ICD-10-CM | POA: Insufficient documentation

## 2021-02-28 DIAGNOSIS — R609 Edema, unspecified: Secondary | ICD-10-CM | POA: Insufficient documentation

## 2021-02-28 DIAGNOSIS — R140 Abdominal distension (gaseous): Secondary | ICD-10-CM | POA: Insufficient documentation

## 2021-02-28 LAB — CBC WITH DIFFERENTIAL/PLATELET
Abs Immature Granulocytes: 0.09 10*3/uL — ABNORMAL HIGH (ref 0.00–0.07)
Basophils Absolute: 0 10*3/uL (ref 0.0–0.1)
Basophils Relative: 0 %
Eosinophils Absolute: 0.2 10*3/uL (ref 0.0–0.5)
Eosinophils Relative: 2 %
HCT: 21.6 % — ABNORMAL LOW (ref 36.0–46.0)
Hemoglobin: 7.5 g/dL — ABNORMAL LOW (ref 12.0–15.0)
Immature Granulocytes: 1 %
Lymphocytes Relative: 25 %
Lymphs Abs: 2 10*3/uL (ref 0.7–4.0)
MCH: 31 pg (ref 26.0–34.0)
MCHC: 34.7 g/dL (ref 30.0–36.0)
MCV: 89.3 fL (ref 80.0–100.0)
Monocytes Absolute: 0.5 10*3/uL (ref 0.1–1.0)
Monocytes Relative: 6 %
Neutro Abs: 5.3 10*3/uL (ref 1.7–7.7)
Neutrophils Relative %: 66 %
Platelets: 264 10*3/uL (ref 150–400)
RBC: 2.42 MIL/uL — ABNORMAL LOW (ref 3.87–5.11)
RDW: 15.7 % — ABNORMAL HIGH (ref 11.5–15.5)
WBC: 8.1 10*3/uL (ref 4.0–10.5)
nRBC: 0.4 % — ABNORMAL HIGH (ref 0.0–0.2)

## 2021-02-28 LAB — URINALYSIS, COMPLETE (UACMP) WITH MICROSCOPIC
Bacteria, UA: NONE SEEN
Bilirubin Urine: NEGATIVE
Glucose, UA: NEGATIVE mg/dL
Ketones, ur: NEGATIVE mg/dL
Nitrite: NEGATIVE
Protein, ur: NEGATIVE mg/dL
Specific Gravity, Urine: 1.013 (ref 1.005–1.030)
pH: 6 (ref 5.0–8.0)

## 2021-02-28 LAB — TROPONIN I (HIGH SENSITIVITY): Troponin I (High Sensitivity): 4 ng/L (ref <18)

## 2021-02-28 LAB — BASIC METABOLIC PANEL
Anion gap: 9 (ref 5–15)
BUN: 10 mg/dL (ref 6–20)
CO2: 23 mmol/L (ref 22–32)
Calcium: 8.4 mg/dL — ABNORMAL LOW (ref 8.9–10.3)
Chloride: 107 mmol/L (ref 98–111)
Creatinine, Ser: 0.48 mg/dL (ref 0.44–1.00)
GFR, Estimated: >60 mL/min (ref >60)
Glucose, Bld: 144 mg/dL — ABNORMAL HIGH (ref 70–99)
Potassium: 3.6 mmol/L (ref 3.5–5.1)
Sodium: 139 mmol/L (ref 135–145)

## 2021-02-28 LAB — HEPATIC FUNCTION PANEL
ALT: 76 U/L — ABNORMAL HIGH (ref 0–44)
AST: 60 U/L — ABNORMAL HIGH (ref 15–41)
Albumin: 2.7 g/dL — ABNORMAL LOW (ref 3.5–5.0)
Alkaline Phosphatase: 123 U/L (ref 38–126)
Bilirubin, Direct: <0.1 mg/dL (ref 0.0–0.2)
Total Bilirubin: 0.3 mg/dL (ref 0.3–1.2)
Total Protein: 5.8 g/dL — ABNORMAL LOW (ref 6.5–8.1)

## 2021-02-28 LAB — BRAIN NATRIURETIC PEPTIDE: B Natriuretic Peptide: 138.9 pg/mL — ABNORMAL HIGH (ref 0.0–100.0)

## 2021-02-28 MED ORDER — FERROUS SULFATE 325 (65 FE) MG PO TBEC: 325.0000 mg | DELAYED_RELEASE_TABLET | Freq: Two times a day (BID) | ORAL | 0 refills | Status: DC

## 2021-02-28 MED ORDER — FUROSEMIDE 40 MG PO TABS
20.0000 mg | ORAL_TABLET | Freq: Once | ORAL | Status: AC
  Administered 2021-02-28: 20 mg via ORAL
  Filled 2021-02-28: qty 1

## 2021-02-28 MED ORDER — HYDROCHLOROTHIAZIDE 12.5 MG PO CAPS: 12.5000 mg | ORAL_CAPSULE | Freq: Every day | ORAL | 0 refills | Status: DC

## 2021-02-28 NOTE — ED Notes (Signed)
Pt resting on stretcher in NAD. Visitor at bedside. Pt denies needs at this time

## 2021-02-28 NOTE — ED Triage Notes (Signed)
Pt states she delivered a baby 4 days ago and states she hemorraged after giving birth. Pt states she has been swollen for the past few days but today is worse, she is noticing it most in her lips, tongue face and feet. Pt denies difficulty breathing or swallowing.

## 2021-02-28 NOTE — ED Notes (Signed)
Pt states she had vaginal delivery 3 days ago with complications of hemorrhaging. Pt reports bilateral leg/foot swelling and facial swelling. Pt denies chest pain, sob, and other complications.
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
## 2021-02-28 NOTE — ED Notes (Signed)
Pt back from XR 

## 2021-10-23 IMAGING — CR DG CHEST 2V
2 series · 2 of 2 positions shown · non-contrast
Comparison: 11/24/2020

CLINICAL DATA: Leg swelling

EXAM:
CHEST - 2 VIEW

[chest pa]
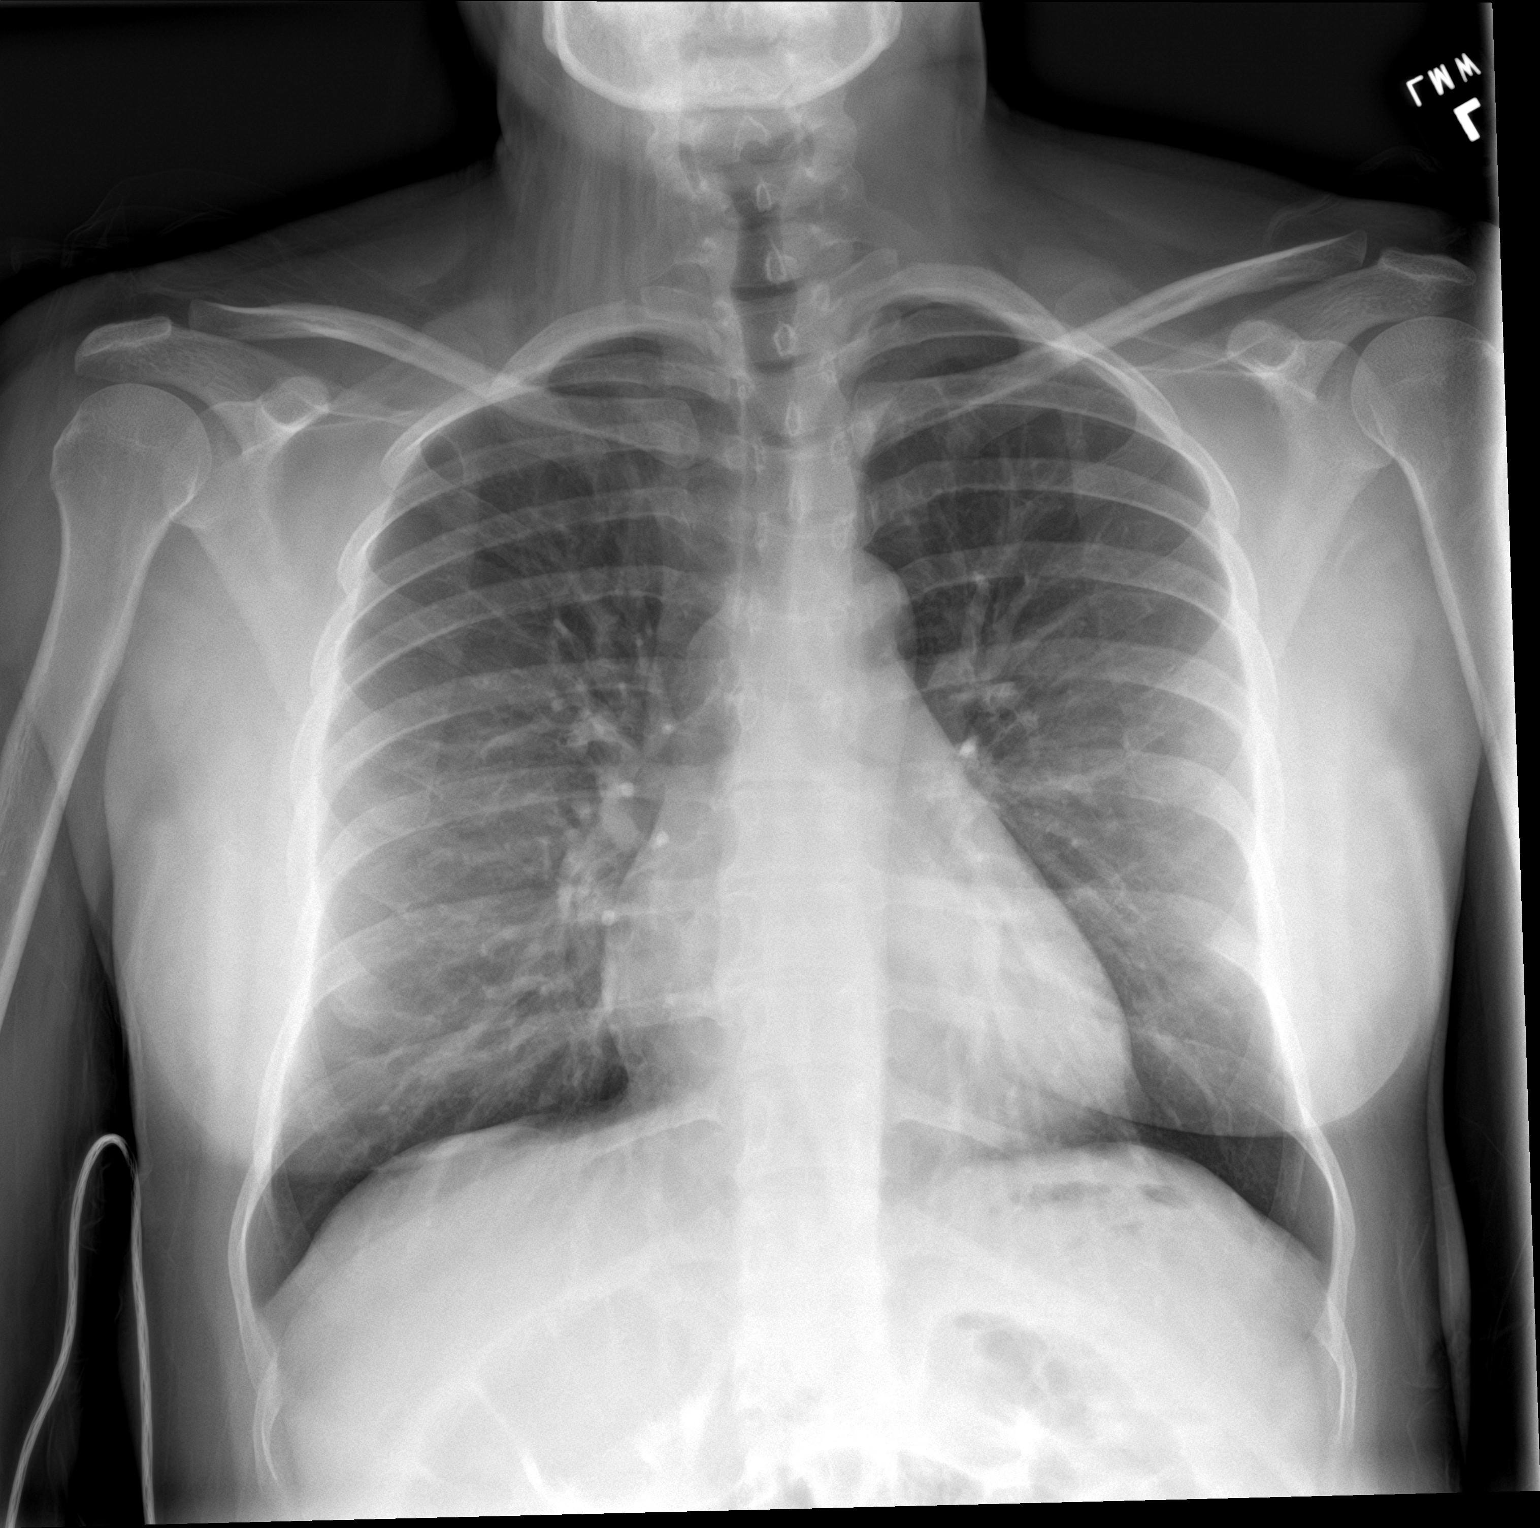

[chest lat]
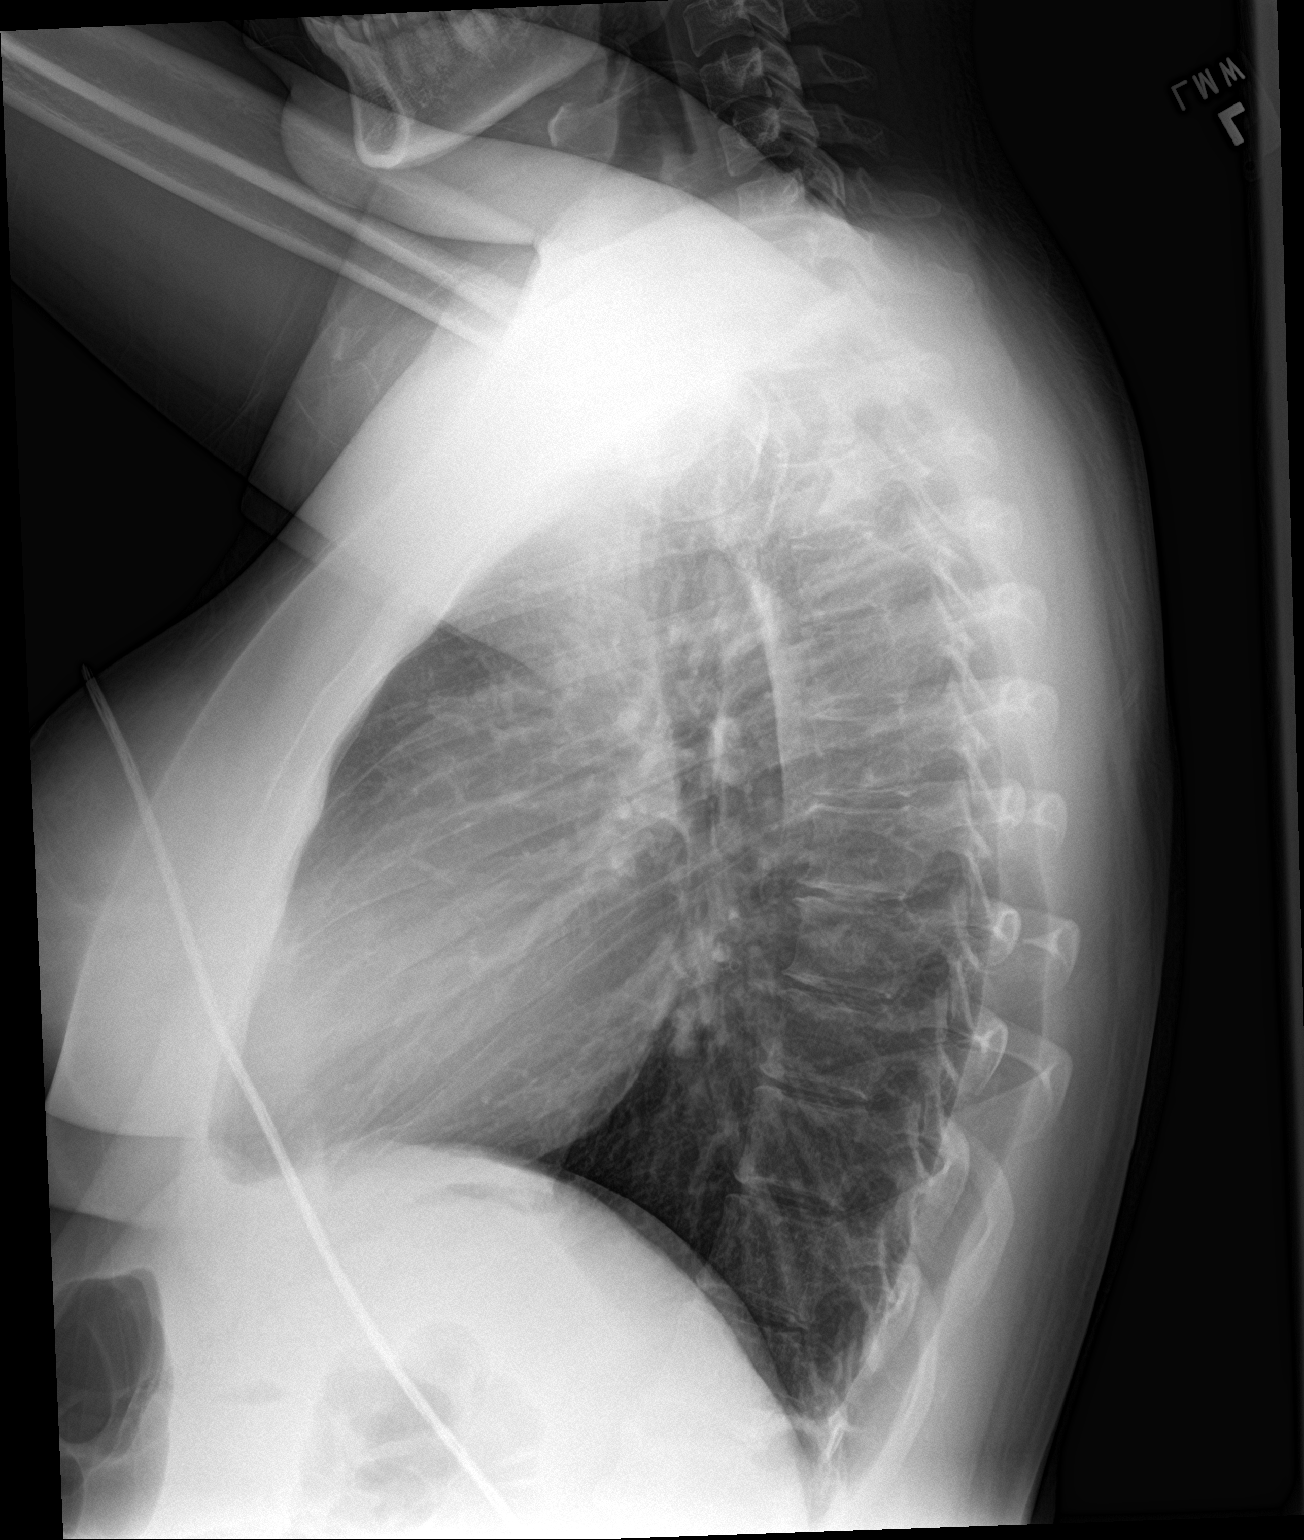

[2 of 2 positions shown; findings below may reference images not displayed]

FINDINGS: The heart size and mediastinal contours are within normal limits.
Both lungs are clear. The visualized skeletal structures are
unremarkable.
IMPRESSION: Negative.

## 2021-11-19 ENCOUNTER — Other Ambulatory Visit: Payer: Self-pay | Admitting: Family Medicine

## 2021-11-19 DIAGNOSIS — Z3480 Encounter for supervision of other normal pregnancy, unspecified trimester: Secondary | ICD-10-CM

## 2021-11-24 NOTE — L&D Delivery Note (Signed)
Delivery Note  First Stage: Labor onset: 0400 Augmentation : AROM Analgesia /Anesthesia intrapartum: none AROM at 0556  Second Stage: Complete dilation at 0635 Onset of pushing at 0640 FHR second stage Cat II- variables  Delivery of a viable female infant on  06/06/2022  at 0649 by CNM delivery of fetal head in ROA position with restitution to ROT. No nuchal cord;  Anterior then posterior shoulders delivered easily with gentle downward traction. Baby placed on mom's chest, and attended to by peds.  Cord double clamped after cessation of pulsation, cut by FOB Cord blood sample collected    Third Stage: active mgmt with pitocin, cytotec placed prophylactically after placenta delivery.  Placenta delivered spontaneously intact with 3VC @ [phone] Placenta disposition: routine disposal Uterine tone firm / bleeding scant  No vaginal, cervical or perineal laceration identified  Anesthesia for repair: n/a Repair n/a Est. Blood Loss (mL): 150  Complications: none  Mom to postpartum.  Baby to Couplet care / Skin to Skin.  Newborn: Birth Weight: pending  Apgar Scores: 8/9 Feeding planned: breast and formula

## 2021-12-31 ENCOUNTER — Other Ambulatory Visit: Payer: Self-pay | Admitting: Family Medicine

## 2021-12-31 ENCOUNTER — Other Ambulatory Visit: Payer: Self-pay

## 2021-12-31 ENCOUNTER — Ambulatory Visit
Admission: RE | Admit: 2021-12-31 | Discharge: 2021-12-31 | Disposition: A | Discharge status: Home / Self Care | Payer: BC Managed Care – PPO | Source: Ambulatory Visit | Attending: Family Medicine | Admitting: Family Medicine

## 2021-12-31 DIAGNOSIS — Z3480 Encounter for supervision of other normal pregnancy, unspecified trimester: Secondary | ICD-10-CM

## 2021-12-31 DIAGNOSIS — Z3687 Encounter for antenatal screening for uncertain dates: Secondary | ICD-10-CM | POA: Insufficient documentation

## 2021-12-31 DIAGNOSIS — Z3A16 16 weeks gestation of pregnancy: Secondary | ICD-10-CM | POA: Insufficient documentation

## 2022-06-06 ENCOUNTER — Other Ambulatory Visit: Payer: Self-pay

## 2022-06-06 ENCOUNTER — Inpatient Hospital Stay
Admission: EM | Admit: 2022-06-06 | Discharge: 2022-06-07 | DRG: 807 | Disposition: A | Discharge status: Home / Self Care | Payer: BC Managed Care – PPO

## 2022-06-06 DIAGNOSIS — Z3A39 39 weeks gestation of pregnancy: Secondary | ICD-10-CM

## 2022-06-06 DIAGNOSIS — Z23 Encounter for immunization: Secondary | ICD-10-CM | POA: Diagnosis not present

## 2022-06-06 DIAGNOSIS — Z8632 Personal history of gestational diabetes: Secondary | ICD-10-CM

## 2022-06-06 DIAGNOSIS — O9902 Anemia complicating childbirth: Secondary | ICD-10-CM | POA: Diagnosis present

## 2022-06-06 DIAGNOSIS — O26893 Other specified pregnancy related conditions, third trimester: Secondary | ICD-10-CM | POA: Diagnosis present

## 2022-06-06 DIAGNOSIS — D509 Iron deficiency anemia, unspecified: Secondary | ICD-10-CM | POA: Diagnosis present

## 2022-06-06 LAB — RPR: RPR Ser Ql: NONREACTIVE

## 2022-06-06 LAB — CBC
HCT: 32.8 % — ABNORMAL LOW (ref 36.0–46.0)
Hemoglobin: 10.4 g/dL — ABNORMAL LOW (ref 12.0–15.0)
MCH: 25.7 pg — ABNORMAL LOW (ref 26.0–34.0)
MCHC: 31.7 g/dL (ref 30.0–36.0)
MCV: 81 fL (ref 80.0–100.0)
Platelets: 266 10*3/uL (ref 150–400)
RBC: 4.05 MIL/uL (ref 3.87–5.11)
RDW: 16.5 % — ABNORMAL HIGH (ref 11.5–15.5)
WBC: 8.4 10*3/uL (ref 4.0–10.5)
nRBC: 0 % (ref 0.0–0.2)

## 2022-06-06 LAB — TYPE AND SCREEN
ABO/RH(D): O POS
Antibody Screen: NEGATIVE

## 2022-06-06 LAB — ABO/RH: ABO/RH(D): O POS

## 2022-06-06 MED ORDER — PRENATAL MULTIVITAMIN CH
1.0000 | ORAL_TABLET | Freq: Every day | ORAL | Status: DC
  Administered 2022-06-06 – 2022-06-07 (×2): 1 via ORAL
  Filled 2022-06-06 (×2): qty 1

## 2022-06-06 MED ORDER — OXYTOCIN BOLUS FROM INFUSION
333.0000 mL | Freq: Once | INTRAVENOUS | Status: AC
  Administered 2022-06-06: 333 mL via INTRAVENOUS

## 2022-06-06 MED ORDER — ACETAMINOPHEN 325 MG PO TABS
650.0000 mg | ORAL_TABLET | ORAL | Status: DC | PRN
  Administered 2022-06-06 – 2022-06-07 (×5): 650 mg via ORAL
  Filled 2022-06-06 (×5): qty 2

## 2022-06-06 MED ORDER — ONDANSETRON HCL 4 MG/2ML IJ SOLN: 4.0000 mg | INTRAMUSCULAR | Status: DC | PRN

## 2022-06-06 MED ORDER — DIPHENHYDRAMINE HCL 25 MG PO CAPS: 25.0000 mg | ORAL_CAPSULE | Freq: Four times a day (QID) | ORAL | Status: DC | PRN

## 2022-06-06 MED ORDER — FERROUS SULFATE 325 (65 FE) MG PO TABS
325.0000 mg | ORAL_TABLET | Freq: Two times a day (BID) | ORAL | Status: DC
  Administered 2022-06-06 – 2022-06-07 (×2): 325 mg via ORAL
  Filled 2022-06-06 (×2): qty 1

## 2022-06-06 MED ORDER — SENNOSIDES-DOCUSATE SODIUM 8.6-50 MG PO TABS
2.0000 | ORAL_TABLET | Freq: Every day | ORAL | Status: DC
  Administered 2022-06-07: 2 via ORAL
  Filled 2022-06-06: qty 2

## 2022-06-06 MED ORDER — DIBUCAINE (PERIANAL) 1 % EX OINT: 1.0000 | TOPICAL_OINTMENT | CUTANEOUS | Status: DC | PRN

## 2022-06-06 MED ORDER — METHYLERGONOVINE MALEATE 0.2 MG/ML IJ SOLN
INTRAMUSCULAR | Status: AC
  Filled 2022-06-06: qty 1

## 2022-06-06 MED ORDER — LACTATED RINGERS IV SOLN: INTRAVENOUS | Status: DC

## 2022-06-06 MED ORDER — SODIUM CHLORIDE 0.9 % IV SOLN: 1.0000 g | INTRAVENOUS | Status: DC

## 2022-06-06 MED ORDER — MISOPROSTOL 200 MCG PO TABS
ORAL_TABLET | ORAL | Status: AC
  Administered 2022-06-06: 800 ug via RECTAL
  Filled 2022-06-06: qty 4

## 2022-06-06 MED ORDER — AMMONIA AROMATIC IN INHA
RESPIRATORY_TRACT | Status: AC
  Filled 2022-06-06: qty 10

## 2022-06-06 MED ORDER — ACETAMINOPHEN 325 MG PO TABS: 650.0000 mg | ORAL_TABLET | ORAL | Status: DC | PRN

## 2022-06-06 MED ORDER — LACTATED RINGERS IV SOLN: 500.0000 mL | INTRAVENOUS | Status: DC | PRN

## 2022-06-06 MED ORDER — ONDANSETRON HCL 4 MG/2ML IJ SOLN: 4.0000 mg | Freq: Four times a day (QID) | INTRAMUSCULAR | Status: DC | PRN

## 2022-06-06 MED ORDER — OXYTOCIN-SODIUM CHLORIDE 30-0.9 UT/500ML-% IV SOLN
2.5000 [IU]/h | INTRAVENOUS | Status: DC
  Administered 2022-06-06: 2.5 [IU]/h via INTRAVENOUS

## 2022-06-06 MED ORDER — OXYTOCIN 10 UNIT/ML IJ SOLN
INTRAMUSCULAR | Status: AC
  Filled 2022-06-06: qty 2

## 2022-06-06 MED ORDER — IBUPROFEN 600 MG PO TABS
600.0000 mg | ORAL_TABLET | Freq: Four times a day (QID) | ORAL | Status: DC
  Administered 2022-06-06 – 2022-06-07 (×5): 600 mg via ORAL
  Filled 2022-06-06 (×5): qty 1

## 2022-06-06 MED ORDER — LIDOCAINE HCL (PF) 1 % IJ SOLN: 30.0000 mL | INTRAMUSCULAR | Status: DC | PRN

## 2022-06-06 MED ORDER — CARBOPROST TROMETHAMINE 250 MCG/ML IM SOLN
INTRAMUSCULAR | Status: AC
  Filled 2022-06-06: qty 1

## 2022-06-06 MED ORDER — TRANEXAMIC ACID-NACL 1000-0.7 MG/100ML-% IV SOLN
INTRAVENOUS | Status: AC
  Filled 2022-06-06: qty 100

## 2022-06-06 MED ORDER — MISOPROSTOL 200 MCG PO TABS
ORAL_TABLET | ORAL | Status: AC
  Filled 2022-06-06: qty 5

## 2022-06-06 MED ORDER — WITCH HAZEL-GLYCERIN EX PADS: 1.0000 | MEDICATED_PAD | CUTANEOUS | Status: DC | PRN

## 2022-06-06 MED ORDER — ONDANSETRON HCL 4 MG PO TABS: 4.0000 mg | ORAL_TABLET | ORAL | Status: DC | PRN

## 2022-06-06 MED ORDER — ZOLPIDEM TARTRATE 5 MG PO TABS: 5.0000 mg | ORAL_TABLET | Freq: Every evening | ORAL | Status: DC | PRN

## 2022-06-06 MED ORDER — SODIUM CHLORIDE 0.9 % IV SOLN: 2.0000 g | Freq: Once | INTRAVENOUS | Status: DC

## 2022-06-06 MED ORDER — BENZOCAINE-MENTHOL 20-0.5 % EX AERO
1.0000 | INHALATION_SPRAY | CUTANEOUS | Status: DC | PRN
  Administered 2022-06-06: 1 via TOPICAL
  Filled 2022-06-06: qty 56

## 2022-06-06 MED ORDER — FENTANYL CITRATE (PF) 100 MCG/2ML IJ SOLN: 50.0000 ug | INTRAMUSCULAR | Status: DC | PRN

## 2022-06-06 MED ORDER — COCONUT OIL OIL: 1.0000 | TOPICAL_OIL | Status: DC | PRN

## 2022-06-06 MED ORDER — SOD CITRATE-CITRIC ACID 500-334 MG/5ML PO SOLN: 30.0000 mL | ORAL | Status: DC | PRN

## 2022-06-06 MED ORDER — OXYTOCIN-SODIUM CHLORIDE 30-0.9 UT/500ML-% IV SOLN
INTRAVENOUS | Status: AC
  Filled 2022-06-06: qty 500

## 2022-06-06 MED ORDER — SIMETHICONE 80 MG PO CHEW: 80.0000 mg | CHEWABLE_TABLET | ORAL | Status: DC | PRN

## 2022-06-06 MED ORDER — LIDOCAINE HCL (PF) 1 % IJ SOLN
INTRAMUSCULAR | Status: AC
  Filled 2022-06-06: qty 30

## 2022-06-06 NOTE — H&P (Signed)
OB History & Physical   History of Present Illness:  Chief Complaint: painful UCs since yesterday, worsened this AM.   HPI:  Denali Sharma is a 26 y.o. [phone] female at 39.0wks dated by LMP and c/w anatomy US at 22wks.  She presents to L&D for painful UCs; denies LOF or VB, has had some light brown discharge intermittently.     Pregnancy Issues: 1. Closely spaced pregnancies, SVD 02/2021 2. Abnormal Pap- s/p colpo done at [facility] 3. Hx GDMA1 last  pregnancy (2022) 4. Hx PP depression 5. Rubella NON-immune 6. Hx PPH with last delivery, QBL 7. Iron deficiency Anemia   Maternal Medical History:   Past Medical History:  Diagnosis Date   Medical history non-contributory     Past Surgical History:  Procedure Laterality Date   NO PAST SURGERIES      No Known Allergies  Prior to Admission medications   Medication Sig Start Date End Date Taking? Authorizing Provider  acetaminophen (TYLENOL) 325 MG tablet Take 2 tablets (650 mg total) by mouth every 6 (six) hours as needed for mild pain. 02/26/21   Jalani Rominger, Prudencio Pair, CNM  benzocaine-Menthol (DERMOPLAST) 20-0.5 % AERO Apply 1 application topically as needed for irritation (perineal discomfort). 02/26/21   Mykel Mohl, Prudencio Pair, CNM  coconut oil OIL Apply 1 application topically as needed. 02/26/21   Janaa Acero, Prudencio Pair, CNM  diphenhydrAMINE (BENADRYL) 25 mg capsule Take 1 capsule (25 mg total) by mouth every 6 (six) hours as needed for itching. 02/26/21   Shraddha Lebron, Prudencio Pair, CNM  docusate sodium (COLACE) 100 MG capsule Take 1 capsule (100 mg total) by mouth 2 (two) times daily. 02/26/21   Daphnie Venturini, Prudencio Pair, CNM  famotidine (PEPCID) 40 MG tablet Take 1 tablet (40 mg total) by mouth daily. Patient not taking: No sig reported 01/06/21 01/06/22  Haroldine Laws, CNM  ferrous sulfate 325 (65 FE) MG EC tablet Take 1 tablet (325 mg total) by mouth 2 (two) times daily. 02/28/21 03/30/21  Shaune Pollack, MD  hydrochlorothiazide (MICROZIDE) 12.5 MG capsule Take  1 capsule (12.5 mg total) by mouth daily for 5 days. 02/28/21 03/05/21  Shaune Pollack, MD  ibuprofen (ADVIL) 600 MG tablet Take 1 tablet (600 mg total) by mouth every 6 (six) hours. 02/26/21   Vonda Harth, Prudencio Pair, CNM  Prenatal Vit-Fe Fumarate-FA (PRENATAL MULTIVITAMIN) TABS tablet Take 1 tablet by mouth daily at 12 noon.    [provider]  simethicone (MYLICON) 80 MG chewable tablet Chew 1 tablet (80 mg total) by mouth as needed for flatulence. 02/26/21   Shanell Aden, Prudencio Pair, CNM  witch hazel-glycerin (TUCKS) pad Apply 1 application topically continuous. 02/26/21   Alireza Pollack, Prudencio Pair, CNM     Prenatal care site: [facility]  Social History: She  reports that she has never smoked. She has never used smokeless tobacco. She reports that she does not drink alcohol and does not use drugs.  Family History: family history is not on file.   Review of Systems: A full review of systems was performed and negative except as noted in the HPI.     Physical Exam:  Vital Signs: BP 110/72 (BP Location: Left Arm)   Pulse 77   Temp 98.2 F (36.8 C) (Oral)   Resp 16   Ht 5\' 1"  (1.549 m)   Wt 73.9 kg   BMI 30.80 kg/m  General: no acute distress.  HEENT: normocephalic, atraumatic Heart: regular rate & rhythm.  No murmurs/rubs/gallops Lungs: clear  to auscultation bilaterally, normal respiratory effort Abdomen: soft, gravid, non-tender;  EFW: 7.5lbs  Extremities: non-tender, symmetric, no edema bilaterally.  DTRs: 2+  Neurologic: Alert & oriented x 3.    Results for orders placed or performed during the hospital encounter of 06/06/22 (from the past 24 hour(s))  CBC     Status: Abnormal   Collection Time: 06/06/22  4:46 AM  Result Value Ref Range   WBC 8.4 4.0 - 10.5 K/uL   RBC 4.05 3.87 - 5.11 MIL/uL   Hemoglobin 10.4 (L) 12.0 - 15.0 g/dL   HCT 10.9 (L) 32.3 - 55.7 %   MCV 81.0 80.0 - 100.0 fL   MCH 25.7 (L) 26.0 - 34.0 pg   MCHC 31.7 30.0 - 36.0 g/dL   RDW 32.2 (H) 02.5 - 42.7  %   Platelets 266 150 - 400 K/uL   nRBC 0.0 0.0 - 0.2 %    Pertinent Results:  Prenatal Labs: Blood type/Rh O Pos  Antibody screen neg  Rubella  NON-Immune  Varicella Immune  RPR NR  HBsAg Neg  HIV NR  GC neg  Chlamydia neg  Genetic screening No recs found  1 hour GTT 131  3 hour GTT   GBS neg   FHT: 135bpm, mod variability, + accels, variable decels TOCO: 2-4min, palp mod SVE: 5-6cm per RN exam    Cephalic by leopolds  No results found.  Assessment:  Lashauna Arpin is a 26 y.o. [phone] female at 39.0wks with active labor.   Plan:  1. Admit to Labor & Delivery; consents reviewed and obtained  2. Fetal Well being  - Fetal Tracing: Cat II-  intermittent variables - Group B Streptococcus ppx indicated: negative, records from [facility].  - Presentation: cephalic confirmed by exam/:Leopolds   3. Routine OB: - Prenatal labs reviewed, as above - Rh  O Pos - CBC, T&S, RPR on admit - Clear fluids, IVF  4. Monitoring of Labor -  Contractions: external toco in place -  Pelvis proven to 3590grams -  Plan for induction with AROM if augmentation needed.  -  Plan for continuous fetal monitoring  -  Maternal pain control as desired - Anticipate vaginal delivery  5. Post Partum Planning: - Infant feeding: breast and formula - Contraception: nexplanon - Tdap 03/2022  Randa Ngo, CNM 06/06/22 5:31 AM

## 2022-06-06 NOTE — Progress Notes (Signed)
Admission questions completed wit interpreter services [phone]

## 2022-06-06 NOTE — Lactation Note (Signed)
This note was copied from a baby's chart. Lactation Consultation Note  Patient Name: Carla Brown Today's Date: 06/06/2022   Age:26 hours  Mom declined LC assistance at this time. Per mom this is her 4th baby. She has breastfed her previous 3 children with a range of 3-6 months. Her plan is to breast and bottle feed formula as she has with her 2 previous children. Encouraged mom to offer baby to breast first before offering supplementing baby with formula.   Update provided to care nurse.  Lonzo Cloud RN,BSN,IBCLC   [name] 06/06/2022, 2:26 PM
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
## 2022-06-06 NOTE — Progress Notes (Signed)
Labor Progress Note  Carla Brown is a 26 y.o. [phone] at [redacted]w[redacted]d by LMP admitted for active labor  Subjective: feeling painful UCs, some pressure.   Objective: BP 110/72 (BP Location: Left Arm)   Pulse 77   Temp 98.2 F (36.8 C) (Oral)   Resp 16   Ht 5\' 1"  (1.549 m)   Wt 73.9 kg   BMI 30.80 kg/m  Notable VS details: reviewed  Fetal Assessment: FHT:  FHR: 135 bpm, variability: moderate,  accelerations:  Present,  decelerations:  Present variable Category/reactivity:  Category II UC:   regular, every 4-7 minutes SVE:   8/90/-1, AROM performed, small amount clear fluid with normal bloody show.  Membrane status:AROM at 0558 Amniotic color: clear  Labs: Lab Results  Component Value Date   WBC 8.4 06/06/2022   HGB 10.4 (L) 06/06/2022   HCT 32.8 (L) 06/06/2022   MCV 81.0 06/06/2022   PLT 266 06/06/2022    Assessment / Plan: Spontaneous labor, progressing normally  Labor: Progressing normally, AROM performed Preeclampsia:  no e/o Pre-E Fetal Wellbeing:  Category II- intermittent Variables with spont recovery Pain Control:  Labor support without medications I/D:  n/a Anticipated MOD:  NSVD  06/08/2022 Taelor Moncada, CNM 06/06/2022, 5:58 AM

## 2022-06-06 NOTE — OB Triage Note (Addendum)
Carla Brown 25 y.o. presents to Labor & Delivery triage via  ED staff reporting contractions every 5 minutes rated 8/10. She is a [id number] at [redacted]w[redacted]d .She reports feeling a strong sense of pressure with contractions. She denies signs and symptoms consistent with rupture of membranes and she reports some bloody show. She endorses positive fetal movement. External FM and TOCO applied to non-tender abdomen. Initial FHR 150. SVE 5/80/-2. Vital signs obtained and within normal limits. Patient oriented to care environment including call bell and bed control use. McVey, CNM notified of patient's arrival. Plan to admit to labor and delivery. Triage completed with interpreter services #[id number].

## 2022-06-07 LAB — CBC
HCT: 33.1 % — ABNORMAL LOW (ref 36.0–46.0)
Hemoglobin: 10.4 g/dL — ABNORMAL LOW (ref 12.0–15.0)
MCH: 25.7 pg — ABNORMAL LOW (ref 26.0–34.0)
MCHC: 31.4 g/dL (ref 30.0–36.0)
MCV: 81.7 fL (ref 80.0–100.0)
Platelets: 233 10*3/uL (ref 150–400)
RBC: 4.05 MIL/uL (ref 3.87–5.11)
RDW: 16.8 % — ABNORMAL HIGH (ref 11.5–15.5)
WBC: 9.2 10*3/uL (ref 4.0–10.5)
nRBC: 0 % (ref 0.0–0.2)

## 2022-06-07 MED ORDER — MEASLES, MUMPS & RUBELLA VAC IJ SOLR
0.5000 mL | Freq: Once | INTRAMUSCULAR | Status: AC
Start: 2022-06-07 — End: 2022-06-07
  Administered 2022-06-07: 0.5 mL via SUBCUTANEOUS
  Filled 2022-06-07 (×2): qty 0.5

## 2022-06-07 NOTE — Progress Notes (Signed)
Patient discharged home with family.  Discharge instructions, when to follow up, and prescriptions reviewed with patient.  Patient verbalized understanding. Patient will be escorted out by auxiliary.   

## 2022-10-04 ENCOUNTER — Emergency Department
Admission: EM | Admit: 2022-10-04 | Discharge: 2022-10-04 | Disposition: A | Discharge status: Home / Self Care | Payer: BC Managed Care – PPO | Attending: Emergency Medicine | Admitting: Emergency Medicine

## 2022-10-04 ENCOUNTER — Encounter: Payer: Self-pay | Admitting: Emergency Medicine

## 2022-10-04 ENCOUNTER — Other Ambulatory Visit: Payer: Self-pay

## 2022-10-04 ENCOUNTER — Emergency Department: Payer: BC Managed Care – PPO

## 2022-10-04 DIAGNOSIS — N939 Abnormal uterine and vaginal bleeding, unspecified: Secondary | ICD-10-CM | POA: Insufficient documentation

## 2022-10-04 DIAGNOSIS — R519 Headache, unspecified: Secondary | ICD-10-CM | POA: Insufficient documentation

## 2022-10-04 LAB — URINALYSIS, ROUTINE W REFLEX MICROSCOPIC
Bacteria, UA: NONE SEEN
Bilirubin Urine: NEGATIVE
Glucose, UA: NEGATIVE mg/dL
Ketones, ur: NEGATIVE mg/dL
Nitrite: NEGATIVE
Protein, ur: NEGATIVE mg/dL
RBC / HPF: >50 RBC/hpf — ABNORMAL HIGH (ref 0–5)
Specific Gravity, Urine: 1.023 (ref 1.005–1.030)
pH: 7 (ref 5.0–8.0)

## 2022-10-04 LAB — COMPREHENSIVE METABOLIC PANEL
ALT: 18 U/L (ref 0–44)
AST: 20 U/L (ref 15–41)
Albumin: 4.1 g/dL (ref 3.5–5.0)
Alkaline Phosphatase: 66 U/L (ref 38–126)
Anion gap: 5 (ref 5–15)
BUN: 14 mg/dL (ref 6–20)
CO2: 25 mmol/L (ref 22–32)
Calcium: 8.9 mg/dL (ref 8.9–10.3)
Chloride: 109 mmol/L (ref 98–111)
Creatinine, Ser: 0.41 mg/dL — ABNORMAL LOW (ref 0.44–1.00)
GFR, Estimated: >60 mL/min (ref >60)
Glucose, Bld: 90 mg/dL (ref 70–99)
Potassium: 3.9 mmol/L (ref 3.5–5.1)
Sodium: 139 mmol/L (ref 135–145)
Total Bilirubin: 1 mg/dL (ref 0.3–1.2)
Total Protein: 7.5 g/dL (ref 6.5–8.1)

## 2022-10-04 LAB — CBC WITH DIFFERENTIAL/PLATELET
Abs Immature Granulocytes: 0.02 10*3/uL (ref 0.00–0.07)
Basophils Absolute: 0 10*3/uL (ref 0.0–0.1)
Basophils Relative: 0 %
Eosinophils Absolute: 0.2 10*3/uL (ref 0.0–0.5)
Eosinophils Relative: 3 %
HCT: 37.2 % (ref 36.0–46.0)
Hemoglobin: 12.6 g/dL (ref 12.0–15.0)
Immature Granulocytes: 0 %
Lymphocytes Relative: 32 %
Lymphs Abs: 2.2 10*3/uL (ref 0.7–4.0)
MCH: 29.8 pg (ref 26.0–34.0)
MCHC: 33.9 g/dL (ref 30.0–36.0)
MCV: 87.9 fL (ref 80.0–100.0)
Monocytes Absolute: 0.5 10*3/uL (ref 0.1–1.0)
Monocytes Relative: 7 %
Neutro Abs: 4 10*3/uL (ref 1.7–7.7)
Neutrophils Relative %: 58 %
Platelets: 240 10*3/uL (ref 150–400)
RBC: 4.23 MIL/uL (ref 3.87–5.11)
RDW: 12.6 % (ref 11.5–15.5)
WBC: 7 10*3/uL (ref 4.0–10.5)
nRBC: 0 % (ref 0.0–0.2)

## 2022-10-04 LAB — POC URINE PREG, ED: Preg Test, Ur: NEGATIVE

## 2022-10-04 NOTE — ED Triage Notes (Signed)
Per interpreter, pt has the nexplanon in and has had headaches and vaginal bleeding since then. Pt reports she told her MD and they are not able to remove it until February. Pt reports she cannot wait until then

## 2022-10-04 NOTE — Discharge Instructions (Addendum)
-  Please schedule an appointment with the OB/GYN listed in these instructions (Dr. Jean Rosenthal) in regards to possible Nexplanon removal.  Let them know that you are seen here in the emergency department.  -Return to the emergency department anytime if you begin to experience any new or worsening symptoms.

## 2022-10-04 NOTE — ED Provider Notes (Signed)
[facility] Provider Note    Event Date/Time   First MD Initiated Contact with Patient 10/04/22 1306     (approximate)   History   Chief Complaint Nexplanon removal, Vaginal Bleeding, and Headache   HPI Carla Brown is a 26 y.o. female, no significant medical history, presents to the emergency department for evaluation of vaginal bleeding/headache.  She states that she had a Nexplanon placed approximately 2 months ago and that is when her symptoms started.  She states she has been having irregular vaginal bleeding, reporting occasional clots.  She states that her menstrual cycles are not regular at all.  Endorsing some mild midline pelvic pain as well.  In addition endorses some intermittent headaches as well.  Denies fever/chills, myalgias, chest pain, shortness of breath, flank pain, nausea/vomiting, diarrhea, vaginal discharge, dysuria, vision changes, hearing changes, rash/lesions, or dizziness/lightheadedness  History Limitations: Spanish-speaking.        Physical Exam  Triage Vital Signs: ED Triage Vitals  Enc Vitals Group     BP 10/04/22 1247 121/78     Pulse Rate 10/04/22 1247 74     Resp 10/04/22 1247 20     Temp 10/04/22 1247 97.8 F (36.6 C)     Temp Source 10/04/22 1247 Oral     SpO2 10/04/22 1247 98 %     Weight 10/04/22 1246 163 lb 2.3 oz (74 kg)     Height 10/04/22 1246 5' (1.524 m)     Head Circumference --      Peak Flow --      Pain Score 10/04/22 1246 8     Pain Loc --      Pain Edu? --      Excl. in GC? --     Most recent vital signs: Vitals:   10/04/22 1247  BP: 121/78  Pulse: 74  Resp: 20  Temp: 97.8 F (36.6 C)  SpO2: 98%    General: Awake, NAD.  Skin: Warm, dry. No rashes or lesions.  Eyes: PERRL. Conjunctivae normal.  CV: Good peripheral perfusion.  Resp: Normal effort.  Abd: Soft, non-tender. No distention.  Neuro: At baseline. No gross neurological deficits.  Musculoskeletal: Normal ROM of all  extremities.  Physical Exam    ED Results / Procedures / Treatments  Labs (all labs ordered are listed, but only abnormal results are displayed) Labs Reviewed  COMPREHENSIVE METABOLIC PANEL - Abnormal; Notable for the following components:      Result Value   Creatinine, Ser 0.41 (*)    All other components within normal limits  URINALYSIS, ROUTINE W REFLEX MICROSCOPIC - Abnormal; Notable for the following components:   Color, Urine YELLOW (*)    APPearance HAZY (*)    Hgb urine dipstick LARGE (*)    Leukocytes,Ua TRACE (*)    RBC / HPF >50 (*)    Non Squamous Epithelial PRESENT (*)    All other components within normal limits  CBC WITH DIFFERENTIAL/PLATELET  POC URINE PREG, ED     EKG N/A.    RADIOLOGY  ED Provider Interpretation: Pending.  No results found.  PROCEDURES:  Critical Care performed: N/A.  Procedures    MEDICATIONS ORDERED IN ED: Medications - No data to display   IMPRESSION / MDM / ASSESSMENT AND PLAN / ED COURSE  I reviewed the triage vital signs and the nursing notes.  Differential diagnosis includes, but is not limited to, dysmenorrhea, uterine fibroids, polyps, cystitis, implantation bleeding,  ED Course Patient appears well, vitals within normal limits.  NAD.  Urine pregnancy negative.  CBC shows no leukocytosis or anemia.  CMP shows no transaminitis, electrolyte abnormalities, or AKI.  Urinalysis shows large amounts of hemoglobin and trace leukocytes, otherwise no evidence of infection.  Assessment/Plan Patient presents with dysmenorrhea and headaches x2 months, requesting for Nexplanon removal.  I advised the patient that we are unable to remove this here in the emergency department, however I would be happy to provide an initial work-up to rule out other pathologies that may be responsible for her vaginal bleeding.  She expressed interest in this.  Her lab work-up so far appears unremarkable.   Pending ultrasound at this time.  Plan is to reevaluate after ultrasound results populate.  Patient care transferred over to [facility], PA-C at approximately 1600.   Patient's presentation is most consistent with acute complicated illness / injury requiring diagnostic workup.   Clinical Course as of 10/04/22 1713  Sat Oct 04, 2022  1621 Assumed care, pending ultrasound [JP]    Clinical Course User Index [JP] Poggi, Clarnce Flock, PA-C     FINAL CLINICAL IMPRESSION(S) / ED DIAGNOSES   Final diagnoses:  Vaginal bleeding     Rx / DC Orders   ED Discharge Orders     None        Note:  This document was prepared using Dragon voice recognition software and may include unintentional dictation errors.   Teodoro Spray, [place] 10/04/22 1713    Carrie Mew, MD 10/06/22 [phone]

## 2022-10-04 NOTE — ED Provider Notes (Signed)
----------------------------------------- 6:03 PM on 10/04/2022 -----------------------------------------  Blood pressure 121/78, pulse 74, temperature 97.8 F (36.6 C), temperature source Oral, resp. rate 20, height 5' (1.524 m), weight 74 kg, last menstrual period 10/04/2022, SpO2 98 %, unknown if currently breastfeeding.  Assuming care from B. Simpson, PA-C.  In short, Carla Brown is a 26 y.o. female with a chief complaint of Nexplanon removal, Vaginal Bleeding, and Headache  Refer to the original H&P for additional details.  The current plan of care is to await [place], anticipate dc.  ____________________________________________    ED Results / Procedures / Treatments   Labs (all labs ordered are listed, but only abnormal results are displayed) Labs Reviewed  COMPREHENSIVE METABOLIC PANEL - Abnormal; Notable for the following components:      Result Value   Creatinine, Ser 0.41 (*)    All other components within normal limits  URINALYSIS, ROUTINE W REFLEX MICROSCOPIC - Abnormal; Notable for the following components:   Color, Urine YELLOW (*)    APPearance HAZY (*)    Hgb urine dipstick LARGE (*)    Leukocytes,Ua TRACE (*)    RBC / HPF >50 (*)    Non Squamous Epithelial PRESENT (*)    All other components within normal limits  CBC WITH DIFFERENTIAL/PLATELET  POC URINE PREG, ED     EKG     RADIOLOGY I independently reviewed and interpreted imaging and agree with radiologists findings.   ED Provider Interpretation:   US Pelvis Complete  Result Date: 10/04/2022 CLINICAL DATA:  Vaginal bleeding and pelvic pain. EXAM: TRANSABDOMINAL AND TRANSVAGINAL ULTRASOUND OF PELVIS DOPPLER ULTRASOUND OF OVARIES TECHNIQUE: Both transabdominal and transvaginal ultrasound examinations of the pelvis were performed. Transabdominal technique was performed for global imaging of the pelvis including uterus, ovaries, adnexal regions, and pelvic cul-de-sac. It was necessary to proceed  with endovaginal exam following the transabdominal exam to visualize the ovaries and endometrium. Color and duplex Doppler ultrasound was utilized to evaluate blood flow to the ovaries. COMPARISON:  None Available. FINDINGS: Uterus Measurements: 7.2 x 4.1 x 5.6 = volume: 86.7 mL. No fibroids or other mass visualized. Endometrium Thickness: 1 mm.  No focal abnormality visualized. Right ovary Measurements: 4.0 x 2.4 x 2.0 = volume: 10.1 mL. Echogenic structure without blood flow measuring 1.1 x 1.1 x 1.1 cm, likely a small dermoid. Left ovary Measurements: 3.3 x 2.0 x 2.2 = volume: 7.1 mL. Normal appearance/no adnexal mass. Pulsed Doppler evaluation of both ovaries demonstrates normal low-resistance arterial and venous waveforms. Other findings No abnormal free fluid. IMPRESSION: 1. No evidence for ovarian torsion. 2. 1.1 cm echogenic structure in the right ovary, likely a small dermoid. Electronically Signed   By: Keane Police D.O.   On: 10/04/2022 17:25   US Transvaginal Non-OB  Result Date: 10/04/2022 CLINICAL DATA:  Vaginal bleeding and pelvic pain. EXAM: TRANSABDOMINAL AND TRANSVAGINAL ULTRASOUND OF PELVIS DOPPLER ULTRASOUND OF OVARIES TECHNIQUE: Both transabdominal and transvaginal ultrasound examinations of the pelvis were performed. Transabdominal technique was performed for global imaging of the pelvis including uterus, ovaries, adnexal regions, and pelvic cul-de-sac. It was necessary to proceed with endovaginal exam following the transabdominal exam to visualize the ovaries and endometrium. Color and duplex Doppler ultrasound was utilized to evaluate blood flow to the ovaries. COMPARISON:  None Available. FINDINGS: Uterus Measurements: 7.2 x 4.1 x 5.6 = volume: 86.7 mL. No fibroids or other mass visualized. Endometrium Thickness: 1 mm.  No focal abnormality visualized. Right ovary Measurements: 4.0 x 2.4 x 2.0 =  volume: 10.1 mL. Echogenic structure without blood flow measuring 1.1 x 1.1 x 1.1 cm, likely  a small dermoid. Left ovary Measurements: 3.3 x 2.0 x 2.2 = volume: 7.1 mL. Normal appearance/no adnexal mass. Pulsed Doppler evaluation of both ovaries demonstrates normal low-resistance arterial and venous waveforms. Other findings No abnormal free fluid. IMPRESSION: 1. No evidence for ovarian torsion. 2. 1.1 cm echogenic structure in the right ovary, likely a small dermoid. Electronically Signed   By: Larose Hires D.O.   On: 10/04/2022 17:25     PROCEDURES:  Critical Care performed: No  Procedures   MEDICATIONS ORDERED IN ED: Medications - No data to display   IMPRESSION / MDM / ASSESSMENT AND PLAN / ED COURSE  I reviewed the triage vital signs and the nursing notes.                              Differential diagnosis includes, but is not limited to, Symptomatic anemia, uterine fibroids, abnormal uterine bleeding.  Blood work is reassuring.  I assumed care while awaiting the ultrasound.  Ultrasound demonstrates no acute findings with exception of a small dermoid cyst.  She has no unilateral pain to suggest intermittent torsion.  On my repeat abdominal exam, she has absolutely no tenderness.  There is no evidence of torsion on exam.  Plan is to follow-up with OB/GYN for Nexplanon removal.  Patient's presentation is most consistent with acute complicated illness / injury requiring diagnostic workup.  The patient is on the cardiac monitor to evaluate for evidence of arrhythmia and/or significant heart rate changes.  Patient's diagnosis is consistent with dysfunctional uterine bleeding. Patient will be discharged home. Patient is to follow up with Dr. Jean Rosenthal with OB/GYN as needed or otherwise directed. Patient is given ED precautions to return to the ED for any worsening or new symptoms.  Clinical Course as of 10/04/22 1816  Sat Oct 04, 2022  1621 Assumed care, pending ultrasound [JP]    Clinical Course User Index [JP] Lolah Coghlan, Herb Grays, PA-C    FINAL CLINICAL IMPRESSION(S) / ED  DIAGNOSES   Final diagnoses:  Vaginal bleeding     Rx / DC Orders   ED Discharge Orders     None        Note:  This document was prepared using Dragon voice recognition software and may include unintentional dictation errors.    Keturah Shavers 10/04/22 1817    Shaune Pollack, MD 10/04/22 2158

## 2023-10-22 ENCOUNTER — Other Ambulatory Visit: Payer: Self-pay

## 2023-10-22 ENCOUNTER — Emergency Department
Admission: EM | Admit: 2023-10-22 | Discharge: 2023-10-22 | Disposition: A | Discharge status: Home / Self Care | Payer: Medicaid Other | Attending: Emergency Medicine | Admitting: Emergency Medicine

## 2023-10-22 ENCOUNTER — Emergency Department: Payer: Medicaid Other

## 2023-10-22 DIAGNOSIS — W19XXXA Unspecified fall, initial encounter: Secondary | ICD-10-CM | POA: Insufficient documentation

## 2023-10-22 DIAGNOSIS — S0083XA Contusion of other part of head, initial encounter: Secondary | ICD-10-CM | POA: Diagnosis present

## 2023-10-22 MED ORDER — HYDROCODONE-ACETAMINOPHEN 5-325 MG PO TABS: 1.0000 | ORAL_TABLET | Freq: Four times a day (QID) | ORAL | 0 refills | Status: DC | PRN

## 2023-10-22 MED ORDER — HYDROCODONE-ACETAMINOPHEN 5-325 MG PO TABS
1.0000 | ORAL_TABLET | Freq: Once | ORAL | Status: AC
  Administered 2023-10-22: 1 via ORAL
  Filled 2023-10-22: qty 1

## 2023-10-22 NOTE — ED Notes (Signed)
Pt teaching provided on medications that may cause drowsiness. Pt instructed not to drive or operate heavy machinery while taking the prescribed medication. Pt verbalized understanding.   Pt provided discharge instructions and prescription information. Pt was given the opportunity to ask questions and questions were answered.   
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
## 2023-10-22 NOTE — Discharge Instructions (Signed)
Follow-up with your primary care provider if any continued problems or concerns.  A prescription for pain medication was sent to the pharmacy for you to begin taking as needed for moderate to severe pain.  They also take ibuprofen as needed which will help with pain and inflammation.
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
## 2024-01-29 ENCOUNTER — Encounter: Payer: Self-pay | Admitting: Obstetrics and Gynecology

## 2024-02-10 ENCOUNTER — Other Ambulatory Visit (HOSPITAL_COMMUNITY)
Admission: RE | Admit: 2024-02-10 | Discharge: 2024-02-10 | Disposition: A | Discharge status: Home / Self Care | Source: Ambulatory Visit | Attending: Obstetrics and Gynecology | Admitting: Obstetrics and Gynecology

## 2024-02-10 ENCOUNTER — Encounter: Payer: Self-pay | Admitting: Obstetrics and Gynecology

## 2024-02-10 ENCOUNTER — Ambulatory Visit: Admitting: Obstetrics and Gynecology

## 2024-02-10 VITALS — BP 104/69 | HR 77 | Ht 60.0 in | Wt 178.1 lb

## 2024-02-10 DIAGNOSIS — Z1151 Encounter for screening for human papillomavirus (HPV): Secondary | ICD-10-CM | POA: Insufficient documentation

## 2024-02-10 DIAGNOSIS — R87810 Cervical high risk human papillomavirus (HPV) DNA test positive: Secondary | ICD-10-CM | POA: Insufficient documentation

## 2024-02-10 DIAGNOSIS — R87610 Atypical squamous cells of undetermined significance on cytologic smear of cervix (ASC-US): Secondary | ICD-10-CM | POA: Insufficient documentation

## 2024-02-10 DIAGNOSIS — Z7689 Persons encountering health services in other specified circumstances: Secondary | ICD-10-CM

## 2024-02-10 DIAGNOSIS — Z23 Encounter for immunization: Secondary | ICD-10-CM | POA: Diagnosis not present

## 2024-02-10 DIAGNOSIS — N870 Mild cervical dysplasia: Secondary | ICD-10-CM | POA: Diagnosis not present

## 2024-02-10 NOTE — Progress Notes (Signed)
   HPI:  Carla Brown is a 28 y.o.  [phone]  who presents today for evaluation and management of abnormal cervical cytology.    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  Colposcopy at that time was negative for dysplasia.  Current Pap smear as above. She reports that she has not received Gardasil vaccination.  She desires this today.  ROS:  Pertinent items noted in HPI and remainder of comprehensive ROS otherwise negative.  OB History  Gravida Para Term Preterm AB Living  4 4 4   4   SAB IAB Ectopic Multiple Live Births     0 4    # Outcome Date GA Lbr Len/2nd Weight Sex Type Anes PTL Lv  4 Term 06/06/22 [redacted]w[redacted]d / 00:14 6 lb 13.4 oz (3.1 kg) M Vag-Spont None  LIV  3 Term 02/24/21 105w1d 12:26 / 00:11 7 lb 14.6 oz (3.59 kg) M Vag-Spont None  LIV  2 Term 08/30/16 [redacted]w[redacted]d 09:25 / 00:13 6 lb 11.2 oz (3.04 kg) M Vag-Spont None  LIV  1 Term 08/10/15 [redacted]w[redacted]d / 02:05 7 lb 4.1 oz (3.29 kg) F Vag-Spont Local N LIV    Past Medical History:  Diagnosis Date   Medical history non-contributory     Past Surgical History:  Procedure Laterality Date   NO PAST SURGERIES      SOCIAL HISTORY:  Social History   Substance and Sexual Activity  Alcohol Use No    Social History   Substance and Sexual Activity  Drug Use No     History reviewed. No pertinent family history.  ALLERGIES:  Patient has no known allergies.  She currently has no medications in their medication list.  Physical Exam: -Vitals:  BP 104/69   Pulse 77   Ht 5' (1.524 m)   Wt 178 lb 1.6 oz (80.8 kg)   LMP  (LMP Unknown)   Breastfeeding No   BMI 34.78 kg/m   PROCEDURE: Colposcopy performed with 4% acetic acid after verbal consent obtained                           -Aceto-white Lesions Location(s): See above              -Biopsy performed at 5 and 12 o'clock               -ECC indicated and performed: No.     -Biopsy sites made  hemostatic with pressure and Monsel's solution   -Satisfactory colposcopy: Yes.      -Evidence of Invasive cervical CA :  NO  ASSESSMENT:  Carla Brown is a 28 y.o. [phone] here for  1. Establishing care with new doctor, encounter for   2. ASCUS with positive high risk HPV cervical   3. Screening for human papillomavirus (HPV)   .  PLAN: 1.  I discussed the grading system of pap smears and HPV high risk viral types.  We will discuss management after colpo results return. 2.  Gardasil vaccine today.  No orders of the defined types were placed in this encounter.         F/U  Return in about 2 weeks (around 02/24/2024) for Colpo f/u.  Carla Brown ,MD 02/10/2024,2:32 PM

## 2024-02-10 NOTE — Progress Notes (Signed)
 Patient presents today for a colposcopy. She recently had an abnormal pap smear resulting in ascus/hpv+. No further concerns today.     Patient is a 28 y.o. [id number] female Single Hispanic female who presents for her first Gardasil injection. Order to administer given by Brennan Bailey, MD on 02/10/24.   Given by: Georgiana Shore, CMA Site:  right deltoid   Patient will return in 2 months for second injection.

## 2024-02-12 LAB — SURGICAL PATHOLOGY

## 2024-02-16 LAB — CYTOLOGY - PAP
Comment: NEGATIVE
Comment: NEGATIVE
Comment: NEGATIVE
Diagnosis: UNDETERMINED — AB
HPV 16: NEGATIVE
HPV 18 / 45: NEGATIVE
High risk HPV: POSITIVE — AB

## 2024-02-24 ENCOUNTER — Ambulatory Visit: Admitting: Obstetrics and Gynecology

## 2024-02-24 ENCOUNTER — Encounter: Payer: Self-pay | Admitting: Obstetrics and Gynecology

## 2024-02-24 VITALS — BP 105/71 | HR 69 | Ht 60.0 in | Wt 177.6 lb

## 2024-02-24 DIAGNOSIS — N870 Mild cervical dysplasia: Secondary | ICD-10-CM
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
## 2024-02-24 NOTE — Progress Notes (Signed)
 Patient presents today for a colposcopy follow-up. She states noticing some spotting and slight discomfort with intercourse since her biopsy.

## 2024-04-11 ENCOUNTER — Ambulatory Visit

## 2024-07-05 ENCOUNTER — Encounter: Payer: Self-pay | Admitting: Emergency Medicine

## 2024-07-05 ENCOUNTER — Emergency Department
Admission: EM | Admit: 2024-07-05 | Discharge: 2024-07-05 | Disposition: A | Discharge status: Home / Self Care | Attending: Emergency Medicine | Admitting: Emergency Medicine

## 2024-07-05 ENCOUNTER — Emergency Department

## 2024-07-05 ENCOUNTER — Other Ambulatory Visit: Payer: Self-pay

## 2024-07-05 DIAGNOSIS — R0789 Other chest pain: Secondary | ICD-10-CM | POA: Insufficient documentation

## 2024-07-05 DIAGNOSIS — R0602 Shortness of breath: Secondary | ICD-10-CM | POA: Diagnosis not present

## 2024-07-05 LAB — COMPREHENSIVE METABOLIC PANEL WITH GFR
ALT: 14 U/L (ref 0–44)
AST: 17 U/L (ref 15–41)
Albumin: 4.2 g/dL (ref 3.5–5.0)
Alkaline Phosphatase: 69 U/L (ref 38–126)
Anion gap: 8 (ref 5–15)
BUN: 12 mg/dL (ref 6–20)
CO2: 25 mmol/L (ref 22–32)
Calcium: 8.9 mg/dL (ref 8.9–10.3)
Chloride: 104 mmol/L (ref 98–111)
Creatinine, Ser: 0.56 mg/dL (ref 0.44–1.00)
GFR, Estimated: >60 mL/min (ref >60)
Glucose, Bld: 89 mg/dL (ref 70–99)
Potassium: 3.6 mmol/L (ref 3.5–5.1)
Sodium: 137 mmol/L (ref 135–145)
Total Bilirubin: 0.6 mg/dL (ref 0.0–1.2)
Total Protein: 7.8 g/dL (ref 6.5–8.1)

## 2024-07-05 LAB — TROPONIN I (HIGH SENSITIVITY): Troponin I (High Sensitivity): <2 ng/L (ref <18)

## 2024-07-05 LAB — CBC WITH DIFFERENTIAL/PLATELET
Abs Immature Granulocytes: 0.02 K/uL (ref 0.00–0.07)
Basophils Absolute: 0 K/uL (ref 0.0–0.1)
Basophils Relative: 1 %
Eosinophils Absolute: 0.2 K/uL (ref 0.0–0.5)
Eosinophils Relative: 2 %
HCT: 38.8 % (ref 36.0–46.0)
Hemoglobin: 12.9 g/dL (ref 12.0–15.0)
Immature Granulocytes: 0 %
Lymphocytes Relative: 31 %
Lymphs Abs: 2.6 K/uL (ref 0.7–4.0)
MCH: 29.1 pg (ref 26.0–34.0)
MCHC: 33.2 g/dL (ref 30.0–36.0)
MCV: 87.6 fL (ref 80.0–100.0)
Monocytes Absolute: 0.6 K/uL (ref 0.1–1.0)
Monocytes Relative: 7 %
Neutro Abs: 4.9 K/uL (ref 1.7–7.7)
Neutrophils Relative %: 59 %
Platelets: 270 K/uL (ref 150–400)
RBC: 4.43 MIL/uL (ref 3.87–5.11)
RDW: 12.4 % (ref 11.5–15.5)
WBC: 8.3 K/uL (ref 4.0–10.5)
nRBC: 0 % (ref 0.0–0.2)

## 2024-07-05 LAB — POC URINE PREG, ED: Preg Test, Ur: NEGATIVE

## 2024-07-05 MED ORDER — NAPROXEN 500 MG PO TABS: 500.0000 mg | ORAL_TABLET | Freq: Two times a day (BID) | ORAL | 0 refills | Status: AC

## 2024-07-05 NOTE — ED Triage Notes (Signed)
 Pt reports right sided chest pain that started last night. Pt reports when she picks up her child it makes the pain worse. Pt does endorse some SHOB.

## 2024-07-05 NOTE — ED Notes (Signed)
 PA Triplett stated it is okay for pt to discharge before second troponin is drawn.

## 2024-07-05 NOTE — ED Provider Triage Note (Signed)
 Emergency Medicine Provider Triage Evaluation Note  Zeriah Baysinger , a 28 y.o. female  was evaluated in triage.  Pt complains of 24 hours of epigastric pain, right chest pain that increased with movement.  Patient denies history of GERD, hypertension.  Menstrual period yesterday.  Review of Systems  Positive:  Negative:  Physical Exam  There were no vitals taken for this visit. Gen:   Awake, no distress   Resp:  Normal effort  MSK:   Moves extremities without difficulty Other:    Medical Decision Making  Medically screening exam initiated at 3:35 PM.  Appropriate orders placed.  Nawaal Alling was informed that the remainder of the evaluation will be completed by another provider, this initial triage assessment does not replace that evaluation, and the importance of remaining in the ED until their evaluation is complete. Patient who presents today with history of 24 hours of right chest pain, epigastric pain that increased with movement, or when she is picking up her son.  Pain does not radiate to the left show, left upper extremity or back.  There is CBC CMP troponin checks x-ray EKG    Janit Kast, PA-C 07/05/24 1537

## 2024-07-05 NOTE — ED Provider Notes (Signed)
   [facility] Provider Note    Event Date/Time   First MD Initiated Contact with Patient 07/05/24 1649     (approximate)   History   Chest Pain   HPI  Carla Brown is a 28 y.o. female with no significant past medical history and as listed in EMR presents to the emergency department for treatment and evaluation of right-sided chest pain since last night.  Pain is worse when she lifts her child.  She also has had some intermittent shortness of breath..     Physical Exam    Vitals:   07/05/24 1538 07/05/24 1826  BP: 105/71 110/78  Pulse: 65 63  Resp: 16 16  Temp: 98.5 F (36.9 C)   SpO2: 99% 99%    General: Awake, no distress.  CV:  Good peripheral perfusion.  Resp:  Normal effort.  Breath sounds clear to auscultation Abd:  No distention.  Other:     ED Results / Procedures / Treatments   Labs (all labs ordered are listed, but only abnormal results are displayed)  Labs Reviewed  COMPREHENSIVE METABOLIC PANEL WITH GFR  CBC WITH DIFFERENTIAL/PLATELET  POC URINE PREG, ED  TROPONIN I (HIGH SENSITIVITY)  TROPONIN I (HIGH SENSITIVITY)     EKG  Normal sinus rhythm with a rate of 66, unchanged from previous   RADIOLOGY  Image and radiology report reviewed and interpreted by me. Radiology report consistent with the same.  Chest x-ray shows no acute cardiopulmonary abnormality.  PROCEDURES:  Critical Care performed: No  Procedures   MEDICATIONS ORDERED IN ED:  Medications - No data to display   IMPRESSION / MDM / ASSESSMENT AND PLAN / ED COURSE   I have reviewed the triage note and vital signs. Vital signs stable   Differential diagnosis includes, but is not limited to, musculoskeletal strain, costochondritis, pneumonia, PE, cardiac etiology  Patient's presentation is most consistent with acute presentation with potential threat to life or bodily function.  28 year old female presenting to the emergency  department for treatment and evaluation of right side chest pain since yesterday.  See HPI for further details.  Lab studies including troponin are all reassuring.  EKG is without acute concerns.  Chest x-ray is negative for acute cardiopulmonary abnormality.  PERC negative.  Plan will be to discharge patient home on anti-inflammatories and have her follow-up with primary care if not improving over the next few days.  Patient is comfortable with the plan.   FINAL CLINICAL IMPRESSION(S) / ED DIAGNOSES   Final diagnoses:  Acute chest wall pain     Rx / DC Orders   ED Discharge Orders          Ordered    naproxen  (NAPROSYN ) 500 MG tablet  2 times daily with meals        07/05/24 1723             Note:  This document was prepared using Dragon voice recognition software and may include unintentional dictation errors.   Herlinda Kirk NOVAK, FNP 07/05/24 1845    Willo Dunnings, MD 07/05/24 [phone]
